# Patient Record
Sex: Female | Born: 1955
Health system: Southern US, Community
[De-identification: ages and names within clinical notes are randomized; demographics above are authoritative.]

## PROBLEM LIST (undated history)

## (undated) DIAGNOSIS — I10 Essential (primary) hypertension: Secondary | ICD-10-CM

## (undated) DIAGNOSIS — E7800 Pure hypercholesterolemia, unspecified: Secondary | ICD-10-CM

## (undated) DIAGNOSIS — R079 Chest pain, unspecified: Secondary | ICD-10-CM

## (undated) HISTORY — DX: Chest pain, unspecified: R07.9

## (undated) HISTORY — PX: TONSILLECTOMY: SHX5217

## (undated) HISTORY — DX: Pure hypercholesterolemia, unspecified: E78.00

## (undated) HISTORY — PX: ANKLE SURGERY: SHX546

## (undated) HISTORY — DX: Essential (primary) hypertension: I10

---

## 1998-02-01 ENCOUNTER — Ambulatory Visit (HOSPITAL_COMMUNITY): Admission: RE | Admit: 1998-02-01 | Discharge: 1998-02-01 | Payer: Self-pay | Admitting: *Deleted

## 1998-05-07 ENCOUNTER — Inpatient Hospital Stay (HOSPITAL_COMMUNITY): Admission: EM | Admit: 1998-05-07 | Discharge: 1998-05-08 | Payer: Self-pay | Admitting: Emergency Medicine

## 1999-02-19 ENCOUNTER — Ambulatory Visit (HOSPITAL_COMMUNITY): Admission: RE | Admit: 1999-02-19 | Discharge: 1999-02-19 | Payer: Self-pay | Admitting: *Deleted

## 1999-02-19 ENCOUNTER — Encounter: Payer: Self-pay | Admitting: *Deleted

## 2000-01-15 ENCOUNTER — Other Ambulatory Visit: Admission: RE | Admit: 2000-01-15 | Discharge: 2000-01-15 | Payer: Self-pay | Admitting: *Deleted

## 2000-04-26 ENCOUNTER — Ambulatory Visit (HOSPITAL_COMMUNITY): Admission: RE | Admit: 2000-04-26 | Discharge: 2000-04-26 | Payer: Self-pay | Admitting: *Deleted

## 2000-04-26 ENCOUNTER — Encounter: Payer: Self-pay | Admitting: *Deleted

## 2000-12-20 ENCOUNTER — Other Ambulatory Visit: Admission: RE | Admit: 2000-12-20 | Discharge: 2000-12-20 | Payer: Self-pay | Admitting: *Deleted

## 2001-04-28 ENCOUNTER — Encounter: Payer: Self-pay | Admitting: *Deleted

## 2001-04-28 ENCOUNTER — Ambulatory Visit (HOSPITAL_COMMUNITY): Admission: RE | Admit: 2001-04-28 | Discharge: 2001-04-28 | Payer: Self-pay | Admitting: *Deleted

## 2002-05-16 ENCOUNTER — Encounter: Payer: Self-pay | Admitting: *Deleted

## 2002-05-16 ENCOUNTER — Ambulatory Visit (HOSPITAL_COMMUNITY): Admission: RE | Admit: 2002-05-16 | Discharge: 2002-05-16 | Payer: Self-pay | Admitting: *Deleted

## 2003-06-05 ENCOUNTER — Encounter: Payer: Self-pay | Admitting: Obstetrics and Gynecology

## 2003-06-05 ENCOUNTER — Ambulatory Visit (HOSPITAL_COMMUNITY): Admission: RE | Admit: 2003-06-05 | Discharge: 2003-06-05 | Payer: Self-pay | Admitting: Obstetrics and Gynecology

## 2004-06-17 ENCOUNTER — Ambulatory Visit (HOSPITAL_COMMUNITY): Admission: RE | Admit: 2004-06-17 | Discharge: 2004-06-17 | Payer: Self-pay | Admitting: Obstetrics and Gynecology

## 2005-07-03 ENCOUNTER — Ambulatory Visit (HOSPITAL_COMMUNITY): Admission: RE | Admit: 2005-07-03 | Discharge: 2005-07-03 | Payer: Self-pay | Admitting: Obstetrics and Gynecology

## 2006-07-06 ENCOUNTER — Ambulatory Visit (HOSPITAL_COMMUNITY): Admission: RE | Admit: 2006-07-06 | Discharge: 2006-07-06 | Payer: Self-pay | Admitting: Obstetrics and Gynecology

## 2007-07-12 ENCOUNTER — Ambulatory Visit (HOSPITAL_COMMUNITY): Admission: RE | Admit: 2007-07-12 | Discharge: 2007-07-12 | Payer: Self-pay | Admitting: Internal Medicine

## 2007-09-28 ENCOUNTER — Ambulatory Visit (HOSPITAL_COMMUNITY): Admission: RE | Admit: 2007-09-28 | Discharge: 2007-09-28 | Payer: Self-pay | Admitting: Family Medicine

## 2008-07-17 ENCOUNTER — Ambulatory Visit (HOSPITAL_COMMUNITY): Admission: RE | Admit: 2008-07-17 | Discharge: 2008-07-17 | Payer: Self-pay | Admitting: Obstetrics and Gynecology

## 2009-06-03 ENCOUNTER — Encounter: Admission: RE | Admit: 2009-06-03 | Discharge: 2009-06-03 | Payer: Self-pay | Admitting: Specialist

## 2009-09-13 ENCOUNTER — Ambulatory Visit (HOSPITAL_COMMUNITY): Admission: RE | Admit: 2009-09-13 | Discharge: 2009-09-13 | Payer: Self-pay | Admitting: Obstetrics and Gynecology

## 2010-07-07 ENCOUNTER — Ambulatory Visit: Payer: Self-pay | Admitting: Cardiovascular Disease

## 2010-07-09 ENCOUNTER — Ambulatory Visit: Payer: Self-pay | Admitting: Cardiovascular Disease

## 2010-09-18 ENCOUNTER — Other Ambulatory Visit (HOSPITAL_COMMUNITY): Payer: Self-pay | Admitting: Obstetrics and Gynecology

## 2010-09-18 DIAGNOSIS — Z1239 Encounter for other screening for malignant neoplasm of breast: Secondary | ICD-10-CM

## 2010-09-18 DIAGNOSIS — Z1231 Encounter for screening mammogram for malignant neoplasm of breast: Secondary | ICD-10-CM

## 2010-10-08 ENCOUNTER — Other Ambulatory Visit: Payer: Self-pay

## 2010-10-21 ENCOUNTER — Inpatient Hospital Stay (HOSPITAL_COMMUNITY): Admission: RE | Admit: 2010-10-21 | Payer: Self-pay | Source: Ambulatory Visit

## 2010-11-04 ENCOUNTER — Other Ambulatory Visit: Payer: BC Managed Care – PPO

## 2010-11-19 ENCOUNTER — Other Ambulatory Visit: Payer: BC Managed Care – PPO | Admitting: *Deleted

## 2010-11-25 ENCOUNTER — Ambulatory Visit (HOSPITAL_COMMUNITY)
Admission: RE | Admit: 2010-11-25 | Discharge: 2010-11-25 | Disposition: A | Discharge status: Home / Self Care | Payer: BC Managed Care – PPO | Source: Ambulatory Visit | Attending: Obstetrics and Gynecology | Admitting: Obstetrics and Gynecology

## 2010-11-25 DIAGNOSIS — Z1231 Encounter for screening mammogram for malignant neoplasm of breast: Secondary | ICD-10-CM

## 2010-11-26 ENCOUNTER — Other Ambulatory Visit: Payer: BC Managed Care – PPO | Admitting: *Deleted

## 2010-11-27 ENCOUNTER — Ambulatory Visit (HOSPITAL_COMMUNITY): Payer: BC Managed Care – PPO

## 2010-12-12 ENCOUNTER — Other Ambulatory Visit: Payer: Self-pay | Admitting: *Deleted

## 2010-12-12 DIAGNOSIS — E7800 Pure hypercholesterolemia, unspecified: Secondary | ICD-10-CM

## 2010-12-15 ENCOUNTER — Other Ambulatory Visit: Payer: BC Managed Care – PPO | Admitting: *Deleted

## 2010-12-15 ENCOUNTER — Other Ambulatory Visit: Payer: Self-pay | Admitting: *Deleted

## 2010-12-15 DIAGNOSIS — E785 Hyperlipidemia, unspecified: Secondary | ICD-10-CM

## 2010-12-15 MED ORDER — NIACIN ER (ANTIHYPERLIPIDEMIC) 1000 MG PO TBCR: 1000.0000 mg | EXTENDED_RELEASE_TABLET | Freq: Every day | ORAL | Status: DC

## 2010-12-15 NOTE — Telephone Encounter (Signed)
Fax received from pharmacy. Refill completed. Jodette Kiaira Pointer RN  

## 2010-12-19 ENCOUNTER — Other Ambulatory Visit: Payer: Self-pay | Admitting: *Deleted

## 2010-12-19 DIAGNOSIS — E785 Hyperlipidemia, unspecified: Secondary | ICD-10-CM

## 2010-12-19 MED ORDER — NIACIN ER (ANTIHYPERLIPIDEMIC) 500 MG PO TBCR: 500.0000 mg | EXTENDED_RELEASE_TABLET | Freq: Two times a day (BID) | ORAL | Status: DC

## 2010-12-19 NOTE — Telephone Encounter (Signed)
Fax received from pharmacy. Refill completed. Jodette Adryel Wortmann RN  

## 2010-12-22 ENCOUNTER — Other Ambulatory Visit (INDEPENDENT_AMBULATORY_CARE_PROVIDER_SITE_OTHER): Payer: BC Managed Care – PPO | Admitting: Cardiovascular Disease

## 2010-12-22 ENCOUNTER — Other Ambulatory Visit (INDEPENDENT_AMBULATORY_CARE_PROVIDER_SITE_OTHER): Payer: BC Managed Care – PPO | Admitting: *Deleted

## 2010-12-22 DIAGNOSIS — E785 Hyperlipidemia, unspecified: Secondary | ICD-10-CM

## 2010-12-22 DIAGNOSIS — E7800 Pure hypercholesterolemia, unspecified: Secondary | ICD-10-CM

## 2010-12-22 LAB — LIPID PANEL
Cholesterol: 222 mg/dL — ABNORMAL HIGH (ref 0–200)
HDL: 73.2 mg/dL (ref >39.00)
Triglycerides: 103 mg/dL (ref 0.0–149.0)

## 2010-12-22 LAB — HEPATIC FUNCTION PANEL
ALT: 13 U/L (ref 0–35)
Bilirubin, Direct: 0.1 mg/dL (ref 0.0–0.3)
Total Protein: 6.6 g/dL (ref 6.0–8.3)

## 2010-12-22 LAB — BASIC METABOLIC PANEL
BUN: 16 mg/dL (ref 6–23)
CO2: 28 mEq/L (ref 19–32)
Calcium: 9.5 mg/dL (ref 8.4–10.5)
Chloride: 98 mEq/L (ref 96–112)
Creatinine, Ser: 0.6 mg/dL (ref 0.4–1.2)
GFR: 104.09 mL/min (ref >60.00)
Glucose, Bld: 94 mg/dL (ref 70–99)
Potassium: 4.4 mEq/L (ref 3.5–5.1)
Sodium: 134 mEq/L — ABNORMAL LOW (ref 135–145)

## 2010-12-22 LAB — LDL CHOLESTEROL, DIRECT: Direct LDL: 134.8 mg/dL

## 2010-12-23 ENCOUNTER — Telehealth: Payer: Self-pay | Admitting: *Deleted

## 2010-12-23 NOTE — Telephone Encounter (Signed)
Patient called with lab results/msg left. Jodette Giordano Getman RN  

## 2010-12-24 ENCOUNTER — Telehealth: Payer: Self-pay | Admitting: Cardiovascular Disease

## 2010-12-24 NOTE — Telephone Encounter (Signed)
Called wanting a hard copy of the results from the blood work that was done from Monday this week. I have pulled her chart.

## 2010-12-24 NOTE — Telephone Encounter (Signed)
Copy made and mailed.Alfonso Ramus RN

## 2011-10-21 ENCOUNTER — Other Ambulatory Visit (HOSPITAL_COMMUNITY): Payer: Self-pay | Admitting: Obstetrics and Gynecology

## 2011-10-21 DIAGNOSIS — Z1231 Encounter for screening mammogram for malignant neoplasm of breast: Secondary | ICD-10-CM

## 2011-12-11 ENCOUNTER — Ambulatory Visit (HOSPITAL_COMMUNITY)
Admission: RE | Admit: 2011-12-11 | Discharge: 2011-12-11 | Disposition: A | Discharge status: Home / Self Care | Payer: BC Managed Care – PPO | Source: Ambulatory Visit | Attending: Obstetrics and Gynecology | Admitting: Obstetrics and Gynecology

## 2011-12-11 DIAGNOSIS — Z1231 Encounter for screening mammogram for malignant neoplasm of breast: Secondary | ICD-10-CM | POA: Insufficient documentation

## 2012-01-05 ENCOUNTER — Other Ambulatory Visit: Payer: Self-pay | Admitting: *Deleted

## 2012-01-05 MED ORDER — NIACIN ER (ANTIHYPERLIPIDEMIC) 500 MG PO TBCR: 500.0000 mg | EXTENDED_RELEASE_TABLET | Freq: Every day | ORAL | Status: DC

## 2012-01-05 NOTE — Telephone Encounter (Signed)
Fax Received. Refill Completed. Seanpaul Preece Chowoe (R.M.A)   

## 2012-01-05 NOTE — Telephone Encounter (Signed)
Fax Received. Refill Completed. Calysta Craigo Chowoe (R.M.A)   

## 2012-02-24 ENCOUNTER — Ambulatory Visit: Payer: BC Managed Care – PPO | Admitting: Cardiovascular Disease

## 2012-03-28 ENCOUNTER — Ambulatory Visit (INDEPENDENT_AMBULATORY_CARE_PROVIDER_SITE_OTHER): Payer: BC Managed Care – PPO | Admitting: Cardiovascular Disease

## 2012-03-28 ENCOUNTER — Encounter: Payer: Self-pay | Admitting: Cardiovascular Disease

## 2012-03-28 VITALS — BP 119/80 | HR 95 | Ht 66.0 in | Wt 151.8 lb

## 2012-03-28 DIAGNOSIS — I10 Essential (primary) hypertension: Secondary | ICD-10-CM

## 2012-03-28 DIAGNOSIS — R000 Tachycardia, unspecified: Secondary | ICD-10-CM | POA: Insufficient documentation

## 2012-03-28 DIAGNOSIS — E785 Hyperlipidemia, unspecified: Secondary | ICD-10-CM | POA: Insufficient documentation

## 2012-03-28 MED ORDER — NIACIN ER (ANTIHYPERLIPIDEMIC) 500 MG PO TBCR: 500.0000 mg | EXTENDED_RELEASE_TABLET | Freq: Every day | ORAL | Status: DC

## 2012-03-28 NOTE — Assessment & Plan Note (Signed)
She has a history of hypertension. Blood pressure is well-controlled today. We'll continue to monitor this.  She has been tachycardic in the past. Her heart rate is a little and facet. We'll check a TSH we'll recheck her other labs later this week.

## 2012-03-28 NOTE — Progress Notes (Signed)
    Edgardo Roys Date of Birth  24-Jun-1956       [facility] Office [address]  [address] [place], [place]  [address]   [place], [place]  [address] [phone]     [phone]   Fax  [phone]    Fax [phone]  Problem List: 1. Hypertension 2. Chest pain 3. Hyperlipidemia  History of Present Illness:  Sheri Montgomery is doing well.  She has been busy taking care of her elderly father.  She has not had any cardiac problems.  She is walking several days a week.   Current Outpatient Prescriptions on File Prior to Visit  Medication Sig Dispense Refill  . aspirin 81 MG tablet Take 81 mg by mouth daily.      . Cholecalciferol (VITAMIN D PO) Take by mouth daily.      . Naproxen Sodium (ALEVE PO) Take by mouth as needed.      . niacin (NIASPAN) 500 MG CR tablet Take 1 tablet (500 mg total) by mouth at bedtime.  30 tablet  1  . NON FORMULARY Viactive (Calcium)        Allergies  Allergen Reactions  . Crestor (Rosuvastatin)     Causes muscle aches     Past Medical History  Diagnosis Date  . Hypertension   . Chest pain   . Hypercholesterolemia     Past Surgical History  Procedure Date  . Ankle surgery   . Tonsillectomy     History  Smoking status  . Unknown If Ever Smoked  Smokeless tobacco  . Not on file    History  Alcohol Use  . Yes    Family History  Problem Relation Age of Onset  . Heart attack Father   . Heart attack Brother     Reviw of Systems:  Reviewed in the HPI.  All other systems are negative.  Physical Exam: Blood pressure 119/80, pulse 95, height 5\' 6"  (1.676 m), weight 151 lb 12.8 oz (68.856 kg). General: Well developed, well nourished, in no acute distress.  Head: Normocephalic, atraumatic, sclera non-icteric, mucus membranes are moist,   Neck: Supple. Carotids are 2 + without bruits. No JVD  Lungs: Clear bilaterally to auscultation.  Heart: regular rate.  normal  S1 S2. No  murmurs, gallops or rubs.  Abdomen: Soft, non-tender, non-distended with normal bowel sounds. No hepatomegaly. No rebound/guarding. No masses.  Msk:  Strength and tone are normal  Extremities: No clubbing or cyanosis. No edema.  Distal pedal pulses are 2+ and equal bilaterally.  Neuro: Alert and oriented X 3. Moves all extremities spontaneously.  Psych:  Responds to questions appropriately with a normal affect.  ECG: 03/28/12 - NSR, no ST or T wave changes.  Assessment / Plan:

## 2012-03-28 NOTE — Patient Instructions (Addendum)
Your physician wants you to follow-up in: 1 year You will receive a reminder letter in the mail two months in advance. If you don't receive a letter, please call our office to schedule the follow-up appointment.  Your physician recommends that you return for a FASTING lipid profile: tomorrow and 6 months

## 2012-03-28 NOTE — Assessment & Plan Note (Signed)
She's been maintained on Niaspan. We will check a fasting lipids later this week.

## 2012-03-29 ENCOUNTER — Ambulatory Visit (INDEPENDENT_AMBULATORY_CARE_PROVIDER_SITE_OTHER): Payer: BC Managed Care – PPO | Admitting: *Deleted

## 2012-03-29 DIAGNOSIS — R000 Tachycardia, unspecified: Secondary | ICD-10-CM

## 2012-03-29 DIAGNOSIS — I10 Essential (primary) hypertension: Secondary | ICD-10-CM

## 2012-03-29 DIAGNOSIS — E785 Hyperlipidemia, unspecified: Secondary | ICD-10-CM

## 2012-03-29 LAB — BASIC METABOLIC PANEL
BUN: 16 mg/dL (ref 6–23)
CO2: 30 mEq/L (ref 19–32)
Chloride: 102 mEq/L (ref 96–112)
GFR: 80.98 mL/min (ref >60.00)
Glucose, Bld: 94 mg/dL (ref 70–99)
Potassium: 4.2 mEq/L (ref 3.5–5.1)
Sodium: 140 mEq/L (ref 135–145)

## 2012-03-29 LAB — HEPATIC FUNCTION PANEL
ALT: 22 U/L (ref 0–35)
AST: 32 U/L (ref 0–37)
Albumin: 4.3 g/dL (ref 3.5–5.2)
Alkaline Phosphatase: 54 U/L (ref 39–117)
Bilirubin, Direct: 0.1 mg/dL (ref 0.0–0.3)
Total Protein: 6.9 g/dL (ref 6.0–8.3)

## 2012-03-29 LAB — LIPID PANEL: HDL: 83.4 mg/dL (ref >39.00)

## 2012-04-01 ENCOUNTER — Telehealth: Payer: Self-pay | Admitting: *Deleted

## 2012-04-01 DIAGNOSIS — E785 Hyperlipidemia, unspecified: Secondary | ICD-10-CM

## 2012-04-01 MED ORDER — ATORVASTATIN CALCIUM 10 MG PO TABS: 10.0000 mg | ORAL_TABLET | Freq: Every day | ORAL | Status: DC

## 2012-04-01 NOTE — Telephone Encounter (Signed)
Message copied by Antony Odea on Fri Apr 01, 2012  7:04 PM ------      Message from: Vesta Mixer      Created: Thu Mar 31, 2012  3:59 PM       Thyroid level is okay to.  LDL cholesterol is mildly to moderately elevated. Start atorvastatin 10 mg at night. Recheck labs in 3 months.            Marland Kitchen

## 2012-04-01 NOTE — Telephone Encounter (Signed)
Results discussed, 3 month lab date set, med ordered, pt to call with any questions or concerns

## 2012-07-06 ENCOUNTER — Other Ambulatory Visit (INDEPENDENT_AMBULATORY_CARE_PROVIDER_SITE_OTHER): Payer: BC Managed Care – PPO

## 2012-07-06 DIAGNOSIS — E785 Hyperlipidemia, unspecified: Secondary | ICD-10-CM

## 2012-07-06 LAB — HEPATIC FUNCTION PANEL
ALT: 21 U/L (ref 0–35)
AST: 30 U/L (ref 0–37)
Albumin: 4.4 g/dL (ref 3.5–5.2)
Alkaline Phosphatase: 57 U/L (ref 39–117)
Bilirubin, Direct: 0.1 mg/dL (ref 0.0–0.3)
Total Protein: 7.3 g/dL (ref 6.0–8.3)

## 2012-07-06 LAB — LIPID PANEL
HDL: 78.4 mg/dL (ref >39.00)
Total CHOL/HDL Ratio: 3

## 2012-07-06 LAB — BASIC METABOLIC PANEL
CO2: 28 mEq/L (ref 19–32)
Glucose, Bld: 88 mg/dL (ref 70–99)
Potassium: 4.3 mEq/L (ref 3.5–5.1)
Sodium: 139 mEq/L (ref 135–145)

## 2012-07-08 ENCOUNTER — Other Ambulatory Visit: Payer: Self-pay | Admitting: *Deleted

## 2012-07-08 DIAGNOSIS — E785 Hyperlipidemia, unspecified: Secondary | ICD-10-CM

## 2012-07-08 MED ORDER — ATORVASTATIN CALCIUM 10 MG PO TABS: 10.0000 mg | ORAL_TABLET | Freq: Every day | ORAL | Status: DC

## 2012-07-08 NOTE — Telephone Encounter (Signed)
Pt needed med refill, done.

## 2012-07-19 ENCOUNTER — Encounter: Payer: Self-pay | Admitting: Cardiovascular Disease

## 2012-10-24 ENCOUNTER — Encounter: Payer: Self-pay | Admitting: Cardiovascular Disease

## 2012-10-25 ENCOUNTER — Encounter: Payer: Self-pay | Admitting: Cardiovascular Disease

## 2012-11-29 ENCOUNTER — Other Ambulatory Visit (HOSPITAL_COMMUNITY): Payer: Self-pay | Admitting: Obstetrics and Gynecology

## 2012-11-29 DIAGNOSIS — Z1231 Encounter for screening mammogram for malignant neoplasm of breast: Secondary | ICD-10-CM

## 2012-12-13 ENCOUNTER — Ambulatory Visit (HOSPITAL_COMMUNITY)
Admission: RE | Admit: 2012-12-13 | Discharge: 2012-12-13 | Disposition: A | Discharge status: Home / Self Care | Payer: BC Managed Care – PPO | Source: Ambulatory Visit | Attending: Obstetrics and Gynecology | Admitting: Obstetrics and Gynecology

## 2012-12-13 DIAGNOSIS — Z1231 Encounter for screening mammogram for malignant neoplasm of breast: Secondary | ICD-10-CM

## 2013-03-20 ENCOUNTER — Encounter: Payer: Self-pay | Admitting: Cardiovascular Disease

## 2013-03-22 ENCOUNTER — Other Ambulatory Visit (INDEPENDENT_AMBULATORY_CARE_PROVIDER_SITE_OTHER): Payer: BC Managed Care – PPO

## 2013-03-22 ENCOUNTER — Other Ambulatory Visit: Payer: Self-pay | Admitting: *Deleted

## 2013-03-22 DIAGNOSIS — E785 Hyperlipidemia, unspecified: Secondary | ICD-10-CM

## 2013-03-22 LAB — BASIC METABOLIC PANEL
BUN: 16 mg/dL (ref 6–23)
CO2: 28 mEq/L (ref 19–32)
Chloride: 103 mEq/L (ref 96–112)
Creatinine, Ser: 0.7 mg/dL (ref 0.4–1.2)
Glucose, Bld: 89 mg/dL (ref 70–99)

## 2013-03-22 LAB — HEPATIC FUNCTION PANEL
ALT: 18 U/L (ref 0–35)
AST: 21 U/L (ref 0–37)
Bilirubin, Direct: 0.1 mg/dL (ref 0.0–0.3)
Total Bilirubin: 0.7 mg/dL (ref 0.3–1.2)

## 2013-03-22 LAB — LIPID PANEL: Total CHOL/HDL Ratio: 3

## 2013-03-22 NOTE — Progress Notes (Signed)
Orders for labs done.

## 2013-03-29 ENCOUNTER — Ambulatory Visit (INDEPENDENT_AMBULATORY_CARE_PROVIDER_SITE_OTHER): Payer: BC Managed Care – PPO | Admitting: Cardiovascular Disease

## 2013-03-29 ENCOUNTER — Encounter: Payer: Self-pay | Admitting: Cardiovascular Disease

## 2013-03-29 VITALS — BP 102/68 | HR 83 | Ht 66.0 in | Wt 149.0 lb

## 2013-03-29 DIAGNOSIS — I10 Essential (primary) hypertension: Secondary | ICD-10-CM

## 2013-03-29 DIAGNOSIS — R000 Tachycardia, unspecified: Secondary | ICD-10-CM

## 2013-03-29 DIAGNOSIS — E785 Hyperlipidemia, unspecified: Secondary | ICD-10-CM

## 2013-03-29 MED ORDER — ATORVASTATIN CALCIUM 10 MG PO TABS: 10.0000 mg | ORAL_TABLET | Freq: Every day | ORAL | Status: DC

## 2013-03-29 NOTE — Progress Notes (Signed)
    Sheri Montgomery Date of Birth  1956-08-02       [facility] Office [address]  [address] [place], [place]  [address]   [place], [place]  [address] [phone]     [phone]   Fax  [phone]    Fax [phone]  Problem List: 1. Hypertension 2. Chest pain 3. Hyperlipidemia  History of Present Illness:  Sheri Montgomery is doing well.  She has been busy taking care of her elderly father.  She has not had any cardiac problems.  She is walking several days a week.   March 29, 2013:  Sheri Montgomery is doing well.  Exercising on occasion.  She is not tolerating the niacin.    Trying to stick to a good diet.   Current Outpatient Prescriptions on File Prior to Visit  Medication Sig Dispense Refill  . aspirin 81 MG tablet Take 81 mg by mouth daily.      Marland Kitchen atorvastatin (LIPITOR) 10 MG tablet Take 1 tablet (10 mg total) by mouth daily.  90 tablet  3  . Cholecalciferol (VITAMIN D PO) Take by mouth daily.      . Naproxen Sodium (ALEVE PO) Take by mouth as needed.      . NON FORMULARY Viactive (Calcium)       No current facility-administered medications on file prior to visit.    Allergies  Allergen Reactions  . Crestor (Rosuvastatin)     Causes muscle aches     Past Medical History  Diagnosis Date  . Hypertension   . Chest pain   . Hypercholesterolemia     Past Surgical History  Procedure Laterality Date  . Ankle surgery    . Tonsillectomy      History  Smoking status  . Never Smoker   Smokeless tobacco  . Not on file    History  Alcohol Use  . Yes    Family History  Problem Relation Age of Onset  . Heart attack Father   . Heart attack Brother     Reviw of Systems:  Reviewed in the HPI.  All other systems are negative.  Physical Exam: Blood pressure 102/68, pulse 83, height 5\' 6"  (1.676 m), weight 149 lb (67.586 kg). General: Well developed, well nourished, in no acute distress.  Head: Normocephalic,  atraumatic, sclera non-icteric, mucus membranes are moist,   Neck: Supple. Carotids are 2 + without bruits. No JVD  Lungs: Clear bilaterally to auscultation.  Heart: regular rate.  normal  S1 S2. No murmurs, gallops or rubs.  Abdomen: Soft, non-tender, non-distended with normal bowel sounds. No hepatomegaly. No rebound/guarding. No masses.  Msk:  Strength and tone are normal  Extremities: No clubbing or cyanosis. No edema.  Distal pedal pulses are 2+ and equal bilaterally.  Neuro: Alert and oriented X 3. Moves all extremities spontaneously.  Psych:  Responds to questions appropriately with a normal affect.  ECG: 03/28/12 - NSR, no ST or T wave changes.  Assessment / Plan:

## 2013-03-29 NOTE — Assessment & Plan Note (Signed)
Her lipids are well controlled.   She is not tolerating the niacin.  She may DC it.    I will see her again in 1 year for OV and fasting labs and ECG.

## 2013-03-29 NOTE — Patient Instructions (Addendum)
Your physician wants you to follow-up in: 1 year  You will receive a reminder letter in the mail two months in advance. If you don't receive a letter, please call our office to schedule the follow-up appointment.   Your physician has recommended you make the following change in your medication:  Stop niacin   Your physician recommends that you return for a FASTING lipid profile: 1 year with tsh

## 2013-04-03 ENCOUNTER — Encounter: Payer: Self-pay | Admitting: Cardiovascular Disease

## 2013-07-06 ENCOUNTER — Other Ambulatory Visit: Payer: Self-pay

## 2013-11-15 ENCOUNTER — Other Ambulatory Visit (HOSPITAL_COMMUNITY): Payer: Self-pay | Admitting: Obstetrics and Gynecology

## 2013-11-15 DIAGNOSIS — Z1231 Encounter for screening mammogram for malignant neoplasm of breast: Secondary | ICD-10-CM

## 2013-12-15 ENCOUNTER — Ambulatory Visit (HOSPITAL_COMMUNITY)
Admission: RE | Admit: 2013-12-15 | Discharge: 2013-12-15 | Disposition: A | Discharge status: Home / Self Care | Payer: BC Managed Care – PPO | Source: Ambulatory Visit | Attending: Obstetrics and Gynecology | Admitting: Obstetrics and Gynecology

## 2013-12-15 DIAGNOSIS — Z1231 Encounter for screening mammogram for malignant neoplasm of breast: Secondary | ICD-10-CM

## 2014-04-09 ENCOUNTER — Other Ambulatory Visit: Payer: Self-pay | Admitting: Cardiovascular Disease

## 2014-04-18 ENCOUNTER — Ambulatory Visit: Payer: BC Managed Care – PPO | Admitting: Cardiovascular Disease

## 2014-05-08 ENCOUNTER — Ambulatory Visit (INDEPENDENT_AMBULATORY_CARE_PROVIDER_SITE_OTHER): Payer: BC Managed Care – PPO | Admitting: *Deleted

## 2014-05-08 DIAGNOSIS — R000 Tachycardia, unspecified: Secondary | ICD-10-CM

## 2014-05-08 DIAGNOSIS — E785 Hyperlipidemia, unspecified: Secondary | ICD-10-CM

## 2014-05-08 DIAGNOSIS — I10 Essential (primary) hypertension: Secondary | ICD-10-CM

## 2014-05-08 LAB — LIPID PANEL
CHOL/HDL RATIO: 3
Cholesterol: 217 mg/dL — ABNORMAL HIGH (ref 0–200)
HDL: 77.4 mg/dL (ref >39.00)
LDL Cholesterol: 110 mg/dL — ABNORMAL HIGH (ref 0–99)
NONHDL: 139.6
Triglycerides: 149 mg/dL (ref 0.0–149.0)
VLDL: 29.8 mg/dL (ref 0.0–40.0)

## 2014-05-08 LAB — BASIC METABOLIC PANEL
BUN: 18 mg/dL (ref 6–23)
CALCIUM: 9.4 mg/dL (ref 8.4–10.5)
CHLORIDE: 103 meq/L (ref 96–112)
CO2: 26 mEq/L (ref 19–32)
Creatinine, Ser: 0.8 mg/dL (ref 0.4–1.2)
GFR: 81.59 mL/min (ref >60.00)
Glucose, Bld: 85 mg/dL (ref 70–99)
Potassium: 3.9 mEq/L (ref 3.5–5.1)
Sodium: 139 mEq/L (ref 135–145)

## 2014-05-08 LAB — HEPATIC FUNCTION PANEL
ALT: 18 U/L (ref 0–35)
AST: 29 U/L (ref 0–37)
Albumin: 4.2 g/dL (ref 3.5–5.2)
Alkaline Phosphatase: 55 U/L (ref 39–117)
BILIRUBIN TOTAL: 1 mg/dL (ref 0.2–1.2)
Bilirubin, Direct: 0.1 mg/dL (ref 0.0–0.3)
Total Protein: 7 g/dL (ref 6.0–8.3)

## 2014-05-08 LAB — TSH: TSH: 3.36 u[IU]/mL (ref 0.35–4.50)

## 2014-05-10 ENCOUNTER — Ambulatory Visit (INDEPENDENT_AMBULATORY_CARE_PROVIDER_SITE_OTHER): Payer: BC Managed Care – PPO | Admitting: Cardiovascular Disease

## 2014-05-10 ENCOUNTER — Encounter: Payer: Self-pay | Admitting: Cardiovascular Disease

## 2014-05-10 VITALS — BP 118/80 | HR 86 | Ht 66.0 in | Wt 148.6 lb

## 2014-05-10 DIAGNOSIS — I10 Essential (primary) hypertension: Secondary | ICD-10-CM

## 2014-05-10 DIAGNOSIS — E785 Hyperlipidemia, unspecified: Secondary | ICD-10-CM

## 2014-05-10 MED ORDER — ATORVASTATIN CALCIUM 10 MG PO TABS: ORAL_TABLET | ORAL | Status: DC

## 2014-05-10 NOTE — Progress Notes (Signed)
    Sheri Montgomery Date of Birth  [birth year]       [facility] Office [address]  [address] [place], [place]  [address]   [place], [place]  [address] [phone]     [phone]   Fax  [phone]    Fax [phone]  Problem List: 1. hyperlipidemia 2. Chest pain 3. Hypertension - now controlled with diet.   History of Present Illness:  Sheri Montgomery is doing well.  She has been busy taking care of her elderly father.  She has not had any cardiac problems.  She is walking several days a week.   March 29, 2013:  Sheri Montgomery is doing well.  Exercising on occasion.  She is not tolerating the niacin.    Trying to stick to a good diet.   05/10/2014:    Sheri Montgomery is doing ok.  Still busy taking care of her father (lives in [place])  She was walking regularly until 2 months ago when she injurred her foot.     Current Outpatient Prescriptions on File Prior to Visit  Medication Sig Dispense Refill  . aspirin 81 MG tablet Take 81 mg by mouth daily.      Marland Kitchen atorvastatin (LIPITOR) 10 MG tablet TAKE 1 BY MOUTH DAILY  90 tablet  0  . Cholecalciferol (VITAMIN D PO) Take by mouth daily.      . Naproxen Sodium (ALEVE PO) Take by mouth as needed.      . NON FORMULARY Viactive (Calcium)      . ranitidine (ZANTAC) 75 MG tablet Take 75 mg by mouth 2 (two) times daily.       No current facility-administered medications on file prior to visit.    Allergies  Allergen Reactions  . Crestor [Rosuvastatin]     Causes muscle aches     Past Medical History  Diagnosis Date  . Hypertension   . Chest pain   . Hypercholesterolemia     Past Surgical History  Procedure Laterality Date  . Ankle surgery    . Tonsillectomy      History  Smoking status  . Never Smoker   Smokeless tobacco  . Not on file    History  Alcohol Use  . Yes    Family History  Problem Relation Age of Onset  . Heart attack Father   . Heart attack Brother     Reviw  of Systems:  Reviewed in the HPI.  All other systems are negative.  Physical Exam: Blood pressure 118/80, pulse 86, height  (1.676 m), weight 148 lb 9.6 oz (67.405 kg). General: Well developed, well nourished, in no acute distress.  Head: Normocephalic, atraumatic, sclera non-icteric, mucus membranes are moist,   Neck: Supple. Carotids are 2 + without bruits. No JVD  Lungs: Clear bilaterally to auscultation.  Heart: regular rate.  normal  S1 S2. No murmurs, gallops or rubs.  Abdomen: Soft, non-tender, non-distended with normal bowel sounds. No hepatomegaly. No rebound/guarding. No masses.  Msk:  Strength and tone are normal  Extremities: No clubbing or cyanosis. No edema.  Distal pedal pulses are 2+ and equal bilaterally.  Neuro: Alert and oriented X 3. Moves all extremities spontaneously.  Psych:  Responds to questions appropriately with a normal affect.  ECG: 05/10/2014: Normal sinus rhythm.  She  has no ST or T wave changes to  Assessment / Plan:

## 2014-05-10 NOTE — Patient Instructions (Signed)
Your physician recommends that you continue on your current medications as directed. Please refer to the Current Medication list given to you today.  Your physician recommends that you return for FASTING lab work in 1 year at next OV.   Your physician wants you to follow-up in: 1 year with Dr. Elease Hashimoto. You will receive a reminder letter in the mail two months in advance. If you don't receive a letter, please call our office to schedule the follow-up appointment.

## 2014-05-10 NOTE — Assessment & Plan Note (Signed)
Her lipids are a little bit elevated. I suspect this is because she's not walking as much as she should be. She's injured her foot and has not a lot of exercise for the past several months. She is reporting getting back into her walking program.  Continue with her same medications. We have refilled her on atorvastatin. We'll see her in one year for office visit and fasting lipids, liver enzymes, and basic metabolic profile.

## 2014-11-15 ENCOUNTER — Other Ambulatory Visit (HOSPITAL_COMMUNITY): Payer: Self-pay | Admitting: Obstetrics and Gynecology

## 2014-11-15 DIAGNOSIS — Z1231 Encounter for screening mammogram for malignant neoplasm of breast: Secondary | ICD-10-CM

## 2014-12-19 ENCOUNTER — Ambulatory Visit (HOSPITAL_COMMUNITY)
Admission: RE | Admit: 2014-12-19 | Discharge: 2014-12-19 | Disposition: A | Discharge status: Home / Self Care | Payer: BLUE CROSS/BLUE SHIELD | Source: Ambulatory Visit | Attending: Obstetrics and Gynecology | Admitting: Obstetrics and Gynecology

## 2014-12-19 DIAGNOSIS — Z1231 Encounter for screening mammogram for malignant neoplasm of breast: Secondary | ICD-10-CM | POA: Insufficient documentation

## 2015-05-10 ENCOUNTER — Other Ambulatory Visit (INDEPENDENT_AMBULATORY_CARE_PROVIDER_SITE_OTHER): Payer: BLUE CROSS/BLUE SHIELD | Admitting: *Deleted

## 2015-05-10 DIAGNOSIS — I10 Essential (primary) hypertension: Secondary | ICD-10-CM | POA: Diagnosis not present

## 2015-05-10 DIAGNOSIS — E785 Hyperlipidemia, unspecified: Secondary | ICD-10-CM | POA: Diagnosis not present

## 2015-05-10 LAB — BASIC METABOLIC PANEL
BUN: 14 mg/dL (ref 6–23)
CO2: 25 mEq/L (ref 19–32)
Calcium: 9.7 mg/dL (ref 8.4–10.5)
Chloride: 104 mEq/L (ref 96–112)
Creatinine, Ser: 0.78 mg/dL (ref 0.40–1.20)
GFR: 80.11 mL/min (ref >60.00)
GLUCOSE: 81 mg/dL (ref 70–99)
POTASSIUM: 4.2 meq/L (ref 3.5–5.1)
Sodium: 142 mEq/L (ref 135–145)

## 2015-05-10 LAB — LIPID PANEL
Cholesterol: 196 mg/dL (ref 0–200)
HDL: 74.9 mg/dL (ref >39.00)
LDL Cholesterol: 99 mg/dL (ref 0–99)
NONHDL: 120.83
Total CHOL/HDL Ratio: 3
Triglycerides: 108 mg/dL (ref 0.0–149.0)
VLDL: 21.6 mg/dL (ref 0.0–40.0)

## 2015-05-10 LAB — HEPATIC FUNCTION PANEL
ALT: 25 U/L (ref 0–35)
AST: 31 U/L (ref 0–37)
Albumin: 4.4 g/dL (ref 3.5–5.2)
Alkaline Phosphatase: 68 U/L (ref 39–117)
BILIRUBIN TOTAL: 0.6 mg/dL (ref 0.2–1.2)
Bilirubin, Direct: 0.1 mg/dL (ref 0.0–0.3)
Total Protein: 6.9 g/dL (ref 6.0–8.3)

## 2015-05-10 NOTE — Addendum Note (Signed)
Addended by: Tonita Phoenix on: 05/10/2015 07:51 AM   Modules accepted: Orders

## 2015-05-14 ENCOUNTER — Ambulatory Visit (INDEPENDENT_AMBULATORY_CARE_PROVIDER_SITE_OTHER): Payer: BLUE CROSS/BLUE SHIELD | Admitting: Cardiovascular Disease

## 2015-05-14 ENCOUNTER — Encounter: Payer: Self-pay | Admitting: Cardiovascular Disease

## 2015-05-14 VITALS — BP 130/80 | HR 106 | Ht 66.0 in | Wt 146.0 lb

## 2015-05-14 DIAGNOSIS — E785 Hyperlipidemia, unspecified: Secondary | ICD-10-CM | POA: Diagnosis not present

## 2015-05-14 DIAGNOSIS — R000 Tachycardia, unspecified: Secondary | ICD-10-CM | POA: Diagnosis not present

## 2015-05-14 DIAGNOSIS — I10 Essential (primary) hypertension: Secondary | ICD-10-CM | POA: Diagnosis not present

## 2015-05-14 NOTE — Progress Notes (Signed)
Sheri Montgomery Date of Birth  [birth year]       [facility] Office [address]  [address] [place], [place]  [address]   [place], [place]  [address] [phone]     [phone]   Fax  [phone]    Fax [phone]  Problem List: 1. hyperlipidemia 2. Chest pain 3. Hypertension - now controlled with diet.   History of Present Illness:  Sheri Montgomery is doing well.  She has been busy taking care of her elderly father.  She has not had any cardiac problems.  She is walking several days a week.   March 29, 2013:  Sheri Montgomery is doing well.  Exercising on occasion.  She is not tolerating the niacin.    Trying to stick to a good diet.   05/10/2014:    Sheri Montgomery is doing ok.  Still busy taking care of her father (lives in [place])  She was walking regularly until 2 months ago when she injurred her foot.    Sept. 13, 2016:  Sheri Montgomery is doing well.  Is now on Phentermine - HR is faster .   She feels like her system is "Revved up "   No CP , doing well.   Current Outpatient Prescriptions on File Prior to Visit  Medication Sig Dispense Refill  . aspirin 81 MG tablet Take 81 mg by mouth daily.    Marland Kitchen atorvastatin (LIPITOR) 10 MG tablet TAKE 1 BY MOUTH DAILY 90 tablet 3  . Cholecalciferol (VITAMIN D PO) Take by mouth daily.    . Naproxen Sodium (ALEVE PO) Take 1 tablet by mouth as needed.     . NON FORMULARY Take 1,000 mg by mouth 2 (two) times daily. Viactive (Calcium)     No current facility-administered medications on file prior to visit.    Allergies  Allergen Reactions  . Crestor [Rosuvastatin]     Causes muscle aches     Past Medical History  Diagnosis Date  . Hypertension   . Chest pain   . Hypercholesterolemia     Past Surgical History  Procedure Laterality Date  . Ankle surgery    . Tonsillectomy      History  Smoking status  . Never Smoker   Smokeless tobacco  . Not on file    History  Alcohol Use  . Yes     Family History  Problem Relation Age of Onset  . Heart attack Father   . Heart attack Brother   . Stroke Neg Hx   . Hypertension Father     Reviw of Systems:  Reviewed in the HPI.  All other systems are negative.  Physical Exam: Blood pressure 130/80, pulse 106, height  (1.676 m), weight 66.225 kg (146 lb). General: Well developed, well nourished, in no acute distress.  Head: Normocephalic, atraumatic, sclera non-icteric, mucus membranes are moist,   Neck: Supple. Carotids are 2 + without bruits. No JVD  Lungs: Clear bilaterally to auscultation.  Heart: regular rate.  normal  S1 S2. No murmurs, gallops or rubs.  Abdomen: Soft, non-tender, non-distended with normal bowel sounds. No hepatomegaly. No rebound/guarding. No masses.  Msk:  Strength and tone are normal  Extremities: No clubbing or cyanosis. No edema.  Distal pedal pulses are 2+ and equal bilaterally.  Neuro: Alert and oriented X 3. Moves all extremities spontaneously.  Psych:  Responds to questions appropriately with a normal affect.  ECG: Sept . 13,  2016.  Sinus tach at 106.    Assessment / Plan:   1. Hyperlipidemia -  Recent lipids looks great .  Continue current dose of Atorvastatin   Will check again in 1 year.  2. Chest pain - no recent CP  3. Hypertension - now controlled with diet.  4. Sinus tachycardia - likely due to the Phentermine.   Will have her cut the dose in 1/2 and she will call us with HR and BP in several weeks.    Nahser, Deloris Ping, MD  05/14/2015 11:17 AM    [facility] [address] [place],  [address] [place], [place]  [address] Pager [phone] Phone: [phone]; Fax: [phone]   [facility] [place]  [address] [place], [place]  [address] [phone]   Fax [phone]

## 2015-05-14 NOTE — Patient Instructions (Addendum)
Medication Instructions:  DECREASE Phentermine to 1/2 tab daily  Labwork: Your physician recommends that you return for lab work (cholesterol, liver, bmet) in: 1 year on the day of or a few days before your office visit with Dr. Elease Hashimoto.  You will need to FAST for this appointment - nothing to eat or drink after midnight the night before except water.   Testing/Procedures: None Ordered   Follow-Up: Call Eligha Bridegroom, RN in 1-2 weeks to report your heart rate [phone])  Your physician wants you to follow-up in: 1 year with Dr. Elease Hashimoto.  You will receive a reminder letter in the mail two months in advance. If you don't receive a letter, please call our office to schedule the follow-up appointment.

## 2015-06-11 ENCOUNTER — Other Ambulatory Visit: Payer: Self-pay | Admitting: Cardiovascular Disease

## 2015-08-12 ENCOUNTER — Telehealth: Payer: Self-pay | Admitting: Cardiovascular Disease

## 2015-08-12 DIAGNOSIS — R55 Syncope and collapse: Secondary | ICD-10-CM

## 2015-08-12 NOTE — Telephone Encounter (Signed)
New Message  Pt has list of HR for 2 weeks. Pt requested to discuss these w/ RN. Please call back and discuss.

## 2015-08-12 NOTE — Telephone Encounter (Signed)
Spoke with patient who states she was advised at last office visit with Dr. Elease HashimotoNahser on 9/13 to monitor pulse rate because it was elevated at that visit.  Dr. Elease HashimotoNahser thought it was related to patient taking Phentermine.  He advised her to reduce to 1/2 pill and monitor BP and pulse rate and pulse remained elevated to reduce to 1/4 pill and not to stop medication suddenly.  She states she reduced to 1/4 pill and pulse remained 90 bpm or greater.  She states on November 16, while still taking 1/4 pill, she passed out suddenly while standing in the kitchen.  She states she thought it was related to Phentermine so she stopped it.  States pulse has remained elevated (90 bpm or greater) and then on Saturday 12/10 she felt a funny feelilng come over her and felt her heart racing.  She was able to go and lay down.  States she checked her BP and HR which were:  203/183 mmHg, HR 178 bpm - taken when she first laid down; a little while later she got 86/63 mmHg, HR 184 bpm.  She states she felt a similar feeling yesterday and pulse was 108 at its highest. States pulse remains elevated; today 100's to 116 bpm; BP normal.  I advised her that Dr. Elease HashimotoNahser is working in the hospital this week and that I will discuss s/s with Norma FredricksonLori Gerhardt, NP and call her back.  She verbalized understanding and agreement.

## 2015-08-12 NOTE — Telephone Encounter (Signed)
Spoke with Sheri FredricksonLori Gerhardt, NP who advised we order echocardiogram, event monitor, and TSH and have her see [name] next week.  [name] is in agreement with plan by telephone.  I spoke with patient who verbalized understanding and agreement.  She is scheduled for event monitor and lab appointment tomorrow, 12/13 but will not be able to get an echo until 12/28.  She is scheduled to see [name] on Tuesday 12/20.  I advised her that we will talk to her as results become available and that I will look for a cancellation to get the echo this week.  She thanked me for the call.

## 2015-08-13 ENCOUNTER — Encounter: Payer: Self-pay | Admitting: *Deleted

## 2015-08-13 ENCOUNTER — Ambulatory Visit (INDEPENDENT_AMBULATORY_CARE_PROVIDER_SITE_OTHER): Payer: BLUE CROSS/BLUE SHIELD

## 2015-08-13 ENCOUNTER — Other Ambulatory Visit (INDEPENDENT_AMBULATORY_CARE_PROVIDER_SITE_OTHER): Payer: BLUE CROSS/BLUE SHIELD | Admitting: *Deleted

## 2015-08-13 DIAGNOSIS — I10 Essential (primary) hypertension: Secondary | ICD-10-CM

## 2015-08-13 DIAGNOSIS — R55 Syncope and collapse: Secondary | ICD-10-CM

## 2015-08-13 LAB — TSH: TSH: 2.437 u[IU]/mL (ref 0.350–4.500)

## 2015-08-13 NOTE — Progress Notes (Deleted)
Patient ID: Edgardo RoysCynthia L Punt, female   DOB: [birth year], 59 y.o.   MRN: [id number] Patient arrived for his appointment to have a 30 day cardiac event monitor applied.  After reviewing the function of the monitor and its instructions, the patient declined to have the monitor applied.  He is not thoroughly convinced a monitor is necessary and would like to speak with Dr. Nita Sickleonika Patel and Dr. Ronna PolioJennifer Walker prior to proceeding.

## 2015-08-20 ENCOUNTER — Ambulatory Visit (INDEPENDENT_AMBULATORY_CARE_PROVIDER_SITE_OTHER): Payer: BLUE CROSS/BLUE SHIELD | Admitting: Cardiovascular Disease

## 2015-08-20 ENCOUNTER — Encounter: Payer: Self-pay | Admitting: Cardiovascular Disease

## 2015-08-20 VITALS — BP 122/70 | HR 91 | Ht 66.0 in | Wt 143.8 lb

## 2015-08-20 DIAGNOSIS — I10 Essential (primary) hypertension: Secondary | ICD-10-CM

## 2015-08-20 DIAGNOSIS — E785 Hyperlipidemia, unspecified: Secondary | ICD-10-CM | POA: Diagnosis not present

## 2015-08-20 DIAGNOSIS — R55 Syncope and collapse: Secondary | ICD-10-CM | POA: Diagnosis not present

## 2015-08-20 MED ORDER — METOPROLOL SUCCINATE ER 25 MG PO TB24: 25.0000 mg | ORAL_TABLET | Freq: Every day | ORAL | Status: DC

## 2015-08-20 NOTE — Progress Notes (Signed)
Sheri Montgomery Date of Birth  [birth year]       [facility]    [place] [facility] [address], [address]  [address], [address] [place], [place]  [address]   [place]  [id number] [phone]     [phone]   Fax  [phone]    Fax [phone]  Problem List: 1. hyperlipidemia 2. Chest pain 3. Hypertension - now controlled with diet.   History of Present Illness:  Sheri Montgomery is doing well.  She has been busy taking care of her elderly father.  She has not had any cardiac problems.  She is walking several days a week.   March 29, 2013:  Sheri Montgomery is doing well.  Exercising on occasion.  She is not tolerating the niacin.    Trying to stick to a good diet.   05/10/2014:    Sheri Montgomery is doing ok.  Still busy taking care of her father (lives in [place])  She was walking regularly until 2 months ago when she injurred her foot.    Sept. 13, 2016:  Sheri Montgomery is doing well.  Is now on Phentermine - HR is faster .   She feels like her system is "Revved up "   No CP , doing well.   Dec. 20, 2016 Sheri Montgomery has passed out twice  Has been on Phentermine.   Cut the dose in 1/2 and then to 1/4 tablet.  On Nov. 16, was standing in the kitchen,  Had a split second warning and then passed out. Has been dieting ,  Had not been eating much ( but no less that usual )  Quit Phenermine on that day  Several weeks later,  ( Dec. 10)  She was again standin in the kitchen, felt her hear racing. Could not tell if her HR was regular or irregular . Took her HR and BP  Multiple times for the next hour   Dec. 10, 2016  9:29 203/183 -   HR = 178 9:40           HR  Of 184 9:52 120 /78 HR 123 10:16 120/83  HR 129 11:46 147/94  HR119 1:51 134/88  HR 112 3:48 140/92  HR 115 9:31 112/72  HR 101   Rested for about 30 minutes. Felt normal about 1 hr later.    Has noticed more DOE for the past several weeks.   Current Outpatient Prescriptions on File Prior to Visit  Medication Sig  Dispense Refill  . aspirin 81 MG tablet Take 81 mg by mouth daily.    Marland Kitchen. atorvastatin (LIPITOR) 10 MG tablet TAKE 1 BY MOUTH DAILY 90 tablet 3  . Cholecalciferol (VITAMIN D PO) Take by mouth daily.    Marland Kitchen. docusate sodium (COLACE) 50 MG capsule Take 50 mg by mouth 2 (two) times daily as needed for mild constipation.    . Naproxen Sodium (ALEVE PO) Take 1 tablet by mouth as needed.     . NON FORMULARY Take 1,000 mg by mouth 2 (two) times daily. Viactive (Calcium)    . phentermine (ADIPEX-P) 37.5 MG tablet Take 37.5 mg by mouth daily before breakfast. Reported on 08/20/2015     No current facility-administered medications on file prior to visit.    Allergies  Allergen Reactions  . Crestor [Rosuvastatin]     Causes muscle aches     Past Medical History  Diagnosis Date  . Hypertension   . Chest pain   . Hypercholesterolemia  Past Surgical History  Procedure Laterality Date  . Ankle surgery    . Tonsillectomy      History  Smoking status  . Never Smoker   Smokeless tobacco  . Not on file    History  Alcohol Use  . Yes    Family History  Problem Relation Age of Onset  . Heart attack Father   . Heart attack Brother   . Stroke Neg Hx   . Hypertension Father     Reviw of Systems:  Reviewed in the HPI.  All other systems are negative.  Physical Exam: Blood pressure 122/70, pulse 91, height  (1.676 m), weight 143 lb 12.8 oz (65.227 kg). General: Well developed, well nourished, in no acute distress.  Head: Normocephalic, atraumatic, sclera non-icteric, mucus membranes are moist,   Neck: Supple. Carotids are 2 + without bruits. No JVD  Lungs: Clear bilaterally to auscultation.  Heart: regular rate.  normal  S1 S2. No murmurs, gallops or rubs.  Abdomen: Soft, non-tender, non-distended with normal bowel sounds. No hepatomegaly. No rebound/guarding. No masses.  Msk:  Strength and tone are normal  Extremities: No clubbing or cyanosis. No edema.  Distal pedal  pulses are 2+ and equal bilaterally.  Neuro: Alert and oriented X 3. Moves all extremities spontaneously.  Psych:  Responds to questions appropriately with a normal affect.  ECG: Dec. 20, 2016:  NSR at 91with occasional PVCs  Assessment / Plan:   1. Syncope:  Sheri Asp presents with an episode of syncope.   Several weeks later, she had an unusual episode where her HR was very high and quite variable.   Clinically it sounds like she has paroxysmal atrial fib.  She is wearing a 30 day  Monitor Echo is scheduled.   Will get a stress myoview. TSH is normal  Will start her on toprol XL 25 mg a day  Will see her in 6 weeks.    1. Hyperlipidemia -  Recent lipids looks great .  Continue current dose of Atorvastatin   Will check again in 1 year.  2. Chest pain - no recent CP  3. Hypertension - now controlled with diet.  4. Sinus tachycardia - l This should improve with the low dose toprol    Nahser, Deloris Ping, MD  08/20/2015 9:50 AM    [facility] [address] [place],  [address] [place], [place]  [address] Pager [phone] Phone: [phone]; Fax: [phone]   [facility]  [address] [place], [place]  [address] [phone]   Fax [phone]

## 2015-08-20 NOTE — Patient Instructions (Addendum)
Medication Instructions:  START Toprol XL 25 mg once daily   Labwork: None Ordered   Testing/Procedures: Your physician has requested that you have an exercise stress myoview. For further information please visit [email or web address] Please follow instruction sheet, as given.   Follow-Up: Your physician recommends that you schedule a follow-up appointment in: 6 weeks with Dr. Elease HashimotoNahser   If you need a refill on your cardiac medications before your next appointment, please call your pharmacy.   Thank you for choosing [facility]! Eligha BridegroomMichelle Swinyer, RN [phone]

## 2015-08-27 ENCOUNTER — Telehealth (HOSPITAL_COMMUNITY): Payer: Self-pay | Admitting: *Deleted

## 2015-08-27 NOTE — Telephone Encounter (Signed)
Patient given detailed instructions per Myocardial Perfusion Study Information Sheet for the test on 08/29/16 at 0715. Patient notified to arrive 15 minutes early and that it is imperative to arrive on time for appointment to keep from having the test rescheduled.  If you need to cancel or reschedule your appointment, please call the office within 24 hours of your appointment. Failure to do so may result in a cancellation of your appointment, and a $50 no show fee. Patient verbalized understanding.Clarine Elrod, Adelene IdlerCynthia W

## 2015-08-28 ENCOUNTER — Ambulatory Visit (HOSPITAL_COMMUNITY): Payer: BLUE CROSS/BLUE SHIELD | Attending: Cardiovascular Disease

## 2015-08-28 ENCOUNTER — Other Ambulatory Visit: Payer: Self-pay

## 2015-08-28 DIAGNOSIS — R55 Syncope and collapse: Secondary | ICD-10-CM | POA: Diagnosis not present

## 2015-08-28 DIAGNOSIS — I351 Nonrheumatic aortic (valve) insufficiency: Secondary | ICD-10-CM | POA: Diagnosis not present

## 2015-08-28 DIAGNOSIS — I10 Essential (primary) hypertension: Secondary | ICD-10-CM | POA: Diagnosis not present

## 2015-08-28 DIAGNOSIS — E785 Hyperlipidemia, unspecified: Secondary | ICD-10-CM | POA: Diagnosis not present

## 2015-08-28 DIAGNOSIS — I071 Rheumatic tricuspid insufficiency: Secondary | ICD-10-CM | POA: Diagnosis not present

## 2015-08-28 DIAGNOSIS — I517 Cardiomegaly: Secondary | ICD-10-CM | POA: Insufficient documentation

## 2015-08-28 DIAGNOSIS — I77810 Thoracic aortic ectasia: Secondary | ICD-10-CM | POA: Diagnosis not present

## 2015-08-30 ENCOUNTER — Ambulatory Visit (HOSPITAL_COMMUNITY): Payer: BLUE CROSS/BLUE SHIELD | Attending: Cardiology

## 2015-08-30 ENCOUNTER — Encounter (HOSPITAL_COMMUNITY): Payer: Self-pay

## 2015-08-30 DIAGNOSIS — Z8249 Family history of ischemic heart disease and other diseases of the circulatory system: Secondary | ICD-10-CM | POA: Insufficient documentation

## 2015-08-30 DIAGNOSIS — R55 Syncope and collapse: Secondary | ICD-10-CM

## 2015-08-30 DIAGNOSIS — R0609 Other forms of dyspnea: Secondary | ICD-10-CM | POA: Diagnosis not present

## 2015-08-30 DIAGNOSIS — I10 Essential (primary) hypertension: Secondary | ICD-10-CM | POA: Insufficient documentation

## 2015-08-30 LAB — MYOCARDIAL PERFUSION IMAGING
CHL CUP MPHR: 160 {beats}/min
CHL CUP NUCLEAR SDS: 5
CHL CUP NUCLEAR SRS: 3
CHL CUP NUCLEAR SSS: 8
CHL CUP RESTING HR STRESS: 75 {beats}/min
CSEPEDS: 0 s
Estimated workload: 8.5 METS
Exercise duration (min): 7 min
LV sys vol: 13 mL
LVDIAVOL: 59 mL
NUC STRESS TID: 0.72
Peak HR: 148 {beats}/min
Percent HR: 93 %
RATE: 0.26

## 2015-08-30 MED ORDER — TECHNETIUM TC 99M SESTAMIBI GENERIC - CARDIOLITE
10.2000 | Freq: Once | INTRAVENOUS | Status: AC | PRN
  Administered 2015-08-30: 10 via INTRAVENOUS

## 2015-08-30 MED ORDER — TECHNETIUM TC 99M SESTAMIBI GENERIC - CARDIOLITE
32.9000 | Freq: Once | INTRAVENOUS | Status: AC | PRN
  Administered 2015-08-30: 32.9 via INTRAVENOUS

## 2015-09-23 ENCOUNTER — Encounter: Payer: Self-pay | Admitting: Cardiovascular Disease

## 2015-09-23 ENCOUNTER — Ambulatory Visit (INDEPENDENT_AMBULATORY_CARE_PROVIDER_SITE_OTHER): Payer: BLUE CROSS/BLUE SHIELD | Admitting: Cardiovascular Disease

## 2015-09-23 VITALS — BP 114/74 | HR 84 | Ht 66.0 in | Wt 144.1 lb

## 2015-09-23 DIAGNOSIS — I351 Nonrheumatic aortic (valve) insufficiency: Secondary | ICD-10-CM | POA: Diagnosis not present

## 2015-09-23 DIAGNOSIS — I471 Supraventricular tachycardia, unspecified: Secondary | ICD-10-CM | POA: Insufficient documentation

## 2015-09-23 MED ORDER — METOPROLOL SUCCINATE ER 25 MG PO TB24: 25.0000 mg | ORAL_TABLET | Freq: Every day | ORAL | Status: DC

## 2015-09-23 NOTE — Patient Instructions (Signed)
Medication Instructions:  The current medical regimen is effective;  continue present plan and medications.  Follow-Up: Follow up in 6 months with Dr. Elease Hashimoto.  You will receive a letter in the mail 2 months before you are due.  Please call us when you receive this letter to schedule your follow up appointment.  If you need a refill on your cardiac medications before your next appointment, please call your pharmacy.  Thank you for choosing [facility]!!

## 2015-09-23 NOTE — Progress Notes (Signed)
Sheri Montgomery Date of Birth  1956-01-05       [facility] Office [address]  [address] [place], [place]  [address]   [place], [place]  [address] [phone]     [phone]   Fax  [phone]    Fax [phone]  Problem List: 1. hyperlipidemia 2. Chest pain 3. Hypertension - now controlled with diet.   History of Present Illness:  Sheri Montgomery is doing well.  She has been busy taking care of her elderly father.  She has not had any cardiac problems.  She is walking several days a week.   March 29, 2013:  Sheri Montgomery is doing well.  Exercising on occasion.  She is not tolerating the niacin.    Trying to stick to a good diet.   05/10/2014:    Sheri Montgomery is doing ok.  Still busy taking care of her father (lives in [place])  She was walking regularly until 2 months ago when she injurred her foot.    Sept. 13, 2016:  Sheri Montgomery is doing well.  Is now on Phentermine - HR is faster .   She feels like her system is "Revved up "   No CP , doing well.   09/15/2015 Sheri Montgomery has passed out twice  Has been on Phentermine.   Cut the dose in 1/2 and then to 1/4 tablet.  On Nov. 16, was standing in the kitchen,  Had a split second warning and then passed out. Has been dieting ,  Had not been eating much ( but no less that usual )  Quit Phenermine on that day  Several weeks later,  ( Dec. 10)  She was again standin in the kitchen, felt her hear racing. Could not tell if her HR was regular or irregular . Took her HR and BP  Multiple times for the next hour   Dec. 10, 2016  9:29 203/183 -   HR = 178 9:40           HR  Of 184 9:52 120 /78 HR 123 10:16 120/83  HR 129 11:46 147/94  HR119 1:51 134/88  HR 112 3:48 140/92  HR 115 9:31 112/72  HR 101   Rested for about 30 minutes. Felt normal about 1 hr later.    Has noticed more DOE for the past several weeks.   Jan. 23, 2017: Sheri Montgomery is doing ok.   Had at least 2 episodes of SVT on the  monitor . Was started on metoprolol XL 25 mg a day.   Did not want to increase metoprolol but added Propranolol to her low dose metoprolol .  Stress myoview was normal - no ischemia, normal LV function .     Current Outpatient Prescriptions on File Prior to Visit  Medication Sig Dispense Refill  . aspirin 81 MG tablet Take 81 mg by mouth daily.    Marland Kitchen atorvastatin (LIPITOR) 10 MG tablet TAKE 1 BY MOUTH DAILY 90 tablet 3  . Cholecalciferol (VITAMIN D PO) Take 1 tablet by mouth daily.     Marland Kitchen docusate sodium (COLACE) 50 MG capsule Take 50 mg by mouth 2 (two) times daily as needed for mild constipation.    . metoprolol succinate (TOPROL-XL) 25 MG 24 hr tablet Take 1 tablet (25 mg total) by mouth daily. 31 tablet 11  . Naproxen Sodium (ALEVE PO) Take 1 tablet by mouth as needed (AS NEEDED FOR MILD  PAIN).     . NON FORMULARY Take 1,000 mg by mouth 2 (two) times daily. Viactive (Calcium)     No current facility-administered medications on file prior to visit.    Allergies  Allergen Reactions  . Crestor [Rosuvastatin]     Causes muscle aches     Past Medical History  Diagnosis Date  . Hypertension   . Chest pain   . Hypercholesterolemia     Past Surgical History  Procedure Laterality Date  . Ankle surgery    . Tonsillectomy      History  Smoking status  . Never Smoker   Smokeless tobacco  . Not on file    History  Alcohol Use  . Yes    Family History  Problem Relation Age of Onset  . Heart attack Father   . Heart attack Brother   . Stroke Neg Hx   . Hypertension Father     Reviw of Systems:  Reviewed in the HPI.  All other systems are negative.  Physical Exam: Blood pressure 114/74, pulse 84, height  (1.676 m), weight 144 lb 1.9 oz (65.372 kg). General: Well developed, well nourished, in no acute distress.  Head: Normocephalic, atraumatic, sclera non-icteric, mucus membranes are moist,   Neck: Supple. Carotids are 2 + without bruits. No JVD  Lungs:  Clear bilaterally to auscultation.  Heart: regular rate.  normal  S1 S2. No murmurs, gallops or rubs.  Abdomen: Soft, non-tender, non-distended with normal bowel sounds. No hepatomegaly. No rebound/guarding. No masses.  Msk:  Strength and tone are normal  Extremities: No clubbing or cyanosis. No edema.  Distal pedal pulses are 2+ and equal bilaterally.  Neuro: Alert and oriented X 3. Moves all extremities spontaneously.  Psych:  Responds to questions appropriately with a normal affect.  ECG: Dec. 20, 2016:  NSR at 91with occasional PVCs  Assessment / Plan:   1. Syncope:  No further episodes of syncope.    2. Aortic insufficiency - she has mild AI with mild aortic root dilation.    Will get an echo in December, 2017   3. Hypertension - now controlled with diet.   4. SVT -  Continue metoprolol at current dose.   we discussed the valsalva and the diving reflex. She has not had any significant episodes since starting the metoprolol.  We discussed adding Propranolol but at this point, I would hold off and would start only  if she has recurrent episodes of SVT  5. Hyperlipidemia -  Recent lipids looks great .  Continue current dose of Atorvastatin   Will check again in 1 year.   [name], Deloris Ping, MD  09/23/2015 9:43 AM    [facility] [address] [place] [address] [place],  [address] [place], [place]  [address] Pager [phone] Phone: [phone]; Fax: [phone]   [facility]  [address] [place], [place]  [address] [phone]   Fax [phone]

## 2015-11-11 ENCOUNTER — Other Ambulatory Visit: Payer: Self-pay

## 2015-11-11 DIAGNOSIS — Z1231 Encounter for screening mammogram for malignant neoplasm of breast: Secondary | ICD-10-CM

## 2016-01-07 ENCOUNTER — Ambulatory Visit
Admission: RE | Admit: 2016-01-07 | Discharge: 2016-01-07 | Disposition: A | Discharge status: Home / Self Care | Payer: BLUE CROSS/BLUE SHIELD | Source: Ambulatory Visit

## 2016-01-07 DIAGNOSIS — Z1231 Encounter for screening mammogram for malignant neoplasm of breast: Secondary | ICD-10-CM | POA: Diagnosis not present

## 2016-03-20 ENCOUNTER — Ambulatory Visit (INDEPENDENT_AMBULATORY_CARE_PROVIDER_SITE_OTHER): Payer: BLUE CROSS/BLUE SHIELD | Admitting: Cardiovascular Disease

## 2016-03-20 ENCOUNTER — Encounter: Payer: Self-pay | Admitting: Cardiovascular Disease

## 2016-03-20 VITALS — BP 108/66 | HR 88 | Ht 66.0 in | Wt 144.1 lb

## 2016-03-20 DIAGNOSIS — E785 Hyperlipidemia, unspecified: Secondary | ICD-10-CM | POA: Diagnosis not present

## 2016-03-20 DIAGNOSIS — I471 Supraventricular tachycardia: Secondary | ICD-10-CM | POA: Diagnosis not present

## 2016-03-20 LAB — LIPID PANEL
CHOL/HDL RATIO: 2.1 ratio (ref <=5.0)
Cholesterol: 202 mg/dL — ABNORMAL HIGH (ref 125–200)
HDL: 96 mg/dL (ref >=46)
LDL CALC: 91 mg/dL (ref <130)
TRIGLYCERIDES: 75 mg/dL (ref <150)
VLDL: 15 mg/dL (ref <30)

## 2016-03-20 LAB — COMPREHENSIVE METABOLIC PANEL
ALBUMIN: 4.2 g/dL (ref 3.6–5.1)
ALT: 18 U/L (ref 6–29)
AST: 27 U/L (ref 10–35)
Alkaline Phosphatase: 61 U/L (ref 33–130)
BUN: 17 mg/dL (ref 7–25)
CHLORIDE: 103 mmol/L (ref 98–110)
CO2: 26 mmol/L (ref 20–31)
CREATININE: 0.77 mg/dL (ref 0.50–0.99)
Calcium: 9.5 mg/dL (ref 8.6–10.4)
Glucose, Bld: 98 mg/dL (ref 65–99)
Potassium: 4.3 mmol/L (ref 3.5–5.3)
SODIUM: 139 mmol/L (ref 135–146)
Total Bilirubin: 0.8 mg/dL (ref 0.2–1.2)
Total Protein: 6.7 g/dL (ref 6.1–8.1)

## 2016-03-20 MED ORDER — PROPRANOLOL HCL 10 MG PO TABS: 10.0000 mg | ORAL_TABLET | Freq: Four times a day (QID) | ORAL | Status: DC | PRN

## 2016-03-20 NOTE — Progress Notes (Signed)
Sheri Montgomery Sheri Montgomery Date of Birth  Dec 20, 1955       [facility]    [place] [facility] [address]  [address] [place], [place]  [address]   [place]  [id number] [phone]     [phone]   Fax  [phone]    Fax [phone]  Problem List: 1. hyperlipidemia 2. Chest pain 3. Hypertension - now controlled with diet.   History of Present Illness:  Sheri AspCindy is doing well.  She has been busy taking care of her elderly father.  She has not had any cardiac problems.  She is walking several days a week.   March 29, 2013:  Sheri AspCindy is doing well.  Exercising on occasion.  She is not tolerating the niacin.    Trying to stick to a good diet.   05/10/2014:    Sheri AspCindy is doing ok.  Still busy taking care of her father (lives in [place])  She was walking regularly until 2 months ago when she injurred her foot.    Sept. 13, 2016:  Sheri AspCindy is doing well.  Is now on Phentermine - HR is faster .   She feels like her system is "Revved up "   No CP , doing well.   Dec. 20, 2016 Sheri AspCindy has passed out twice  Has been on Phentermine.   Cut the dose in 1/2 and then to 1/4 tablet.  On Nov. 16, was standing in the kitchen,  Had a split second warning and then passed out. Has been dieting ,  Had not been eating much ( but no less that usual )  Quit Phenermine on that day  Several weeks later,  ( Dec. 10)  She was again standin in the kitchen, felt her hear racing. Could not tell if her HR was regular or irregular . Took her HR and BP  Multiple times for the next hour   Dec. 10, 2016  9:29 203/183 -   HR = 178 9:40           HR  Of 184 9:52 120 /78 HR 123 10:16 120/83  HR 129 11:46 147/94  HR119 1:51 134/88  HR 112 3:48 140/92  HR 115 9:31 112/72  HR 101   Rested for about 30 minutes. Felt normal about 1 hr later.    Has noticed more DOE for the past several weeks.   Jan. 23, 2017: Sheri AspCindy is doing ok.   Had at least 2 episodes of SVT on the  monitor . Was started on metoprolol XL 25 mg a day.   Did not want to increase metoprolol but added Propranolol to her low dose metoprolol .  Stress myoview was normal - no ischemia, normal LV function .   March 20, 2016  Doing ok.   Takes Toprol XL 25 mg after lunch Notices that her HR is faster in the afternoon.  Needs to walk more    Current Outpatient Prescriptions on File Prior to Visit  Medication Sig Dispense Refill  . aspirin 81 MG tablet Take 81 mg by mouth daily.    . Cholecalciferol (VITAMIN D PO) Take 1 tablet by mouth daily.     Marland Kitchen. docusate sodium (COLACE) 50 MG capsule Take 50 mg by mouth 2 (two) times daily as needed for mild constipation.    . metoprolol succinate (TOPROL-XL) 25 MG 24 hr tablet Take 1 tablet (25 mg total) by mouth daily. 90 tablet 3  .  Naproxen Sodium (ALEVE PO) Take 1 tablet by mouth as needed (AS NEEDED FOR MILD PAIN).     . NON FORMULARY Take 1,000 mg by mouth 2 (two) times daily. Viactive (Calcium)     No current facility-administered medications on file prior to visit.    Allergies  Allergen Reactions  . Crestor [Rosuvastatin]     Causes muscle aches     Past Medical History  Diagnosis Date  . Hypertension   . Chest pain   . Hypercholesterolemia     Past Surgical History  Procedure Laterality Date  . Ankle surgery    . Tonsillectomy      History  Smoking status  . Never Smoker   Smokeless tobacco  . Not on file    History  Alcohol Use  . Yes    Family History  Problem Relation Age of Onset  . Heart attack Father   . Hypertension Father   . Heart attack Brother   . Stroke Neg Hx     Reviw of Systems:  Reviewed in the HPI.  All other systems are negative.  Physical Exam: Blood pressure 108/66, pulse 88, height  (1.676 m), weight 144 lb 1.9 oz (65.372 kg), SpO2 98 %. General: Well developed, well nourished, in no acute distress.  Head: Normocephalic, atraumatic, sclera non-icteric, mucus membranes are moist,     Neck: Supple. Carotids are 2 + without bruits. No JVD  Lungs: Clear bilaterally to auscultation.  Heart: regular rate.  normal  S1 S2. No murmurs, gallops or rubs.  Abdomen: Soft, non-tender, non-distended with normal bowel sounds. No hepatomegaly. No rebound/guarding. No masses.  Msk:  Strength and tone are normal  Extremities: No clubbing or cyanosis. No edema.  Distal pedal pulses are 2+ and equal bilaterally.  Neuro: Alert and oriented X 3. Moves all extremities spontaneously.  Psych:  Responds to questions appropriately with a normal affect.  ECG:    Assessment / Plan:   1. Syncope:  No further episodes of syncope.    2. Aortic insufficiency - she has mild AI with mild aortic root dilation.      3. Hypertension - now controlled with diet.   4. SVT -  Continue metoprolol XL  at current dose.   will add PRN propranolol to help with her mild tachycardia that she has in the afternoon   5. Hyperlipidemia -  Recent lipids looks great .  Continue current dose of Atorvastatin   Will check labs  today   Kristeen Miss, MD  03/20/2016 10:44 AM    [facility] [address] [place],  [address] [place], [place]  [address] Pager [phone] Phone: [phone]; Fax: [phone]

## 2016-03-20 NOTE — Patient Instructions (Signed)
Medication Instructions:  TAKE Propranolol 10 mg up to 4 times per day as needed for fast heart rate and/or palpitations   Labwork: TODAY - cholesterol, complete metabolic panel   Testing/Procedures: None Ordered   Follow-Up: Your physician wants you to follow-up in: 6 months with Dr. Elease HashimotoNahser.  You will receive a reminder letter in the mail two months in advance. If you don't receive a letter, please call our office to schedule the follow-up appointment.   If you need a refill on your cardiac medications before your next appointment, please call your pharmacy.   Thank you for choosing [facility]! Eligha BridegroomMichelle Stepfon Rawles, RN [phone]

## 2016-03-25 DIAGNOSIS — Z01419 Encounter for gynecological examination (general) (routine) without abnormal findings: Secondary | ICD-10-CM | POA: Diagnosis not present

## 2016-03-25 DIAGNOSIS — Z6824 Body mass index (BMI) 24.0-24.9, adult: Secondary | ICD-10-CM | POA: Diagnosis not present

## 2016-03-26 ENCOUNTER — Telehealth: Payer: Self-pay | Admitting: Cardiovascular Disease

## 2016-03-26 NOTE — Telephone Encounter (Signed)
New Message ° ° °Pt returning call about lab results. °

## 2016-03-26 NOTE — Telephone Encounter (Signed)
New Message:    Please call,concerning her lab results in my-chart. Also never received her prescription for Propranolol.

## 2016-03-26 NOTE — Telephone Encounter (Signed)
Left detailed message on patient's voice mail that I sent propranolol Rx to [facility] mail order on 7/21. I advised her to call back if she would like me to send it to a local pharmacy.

## 2016-03-26 NOTE — Telephone Encounter (Signed)
Follow Up:     Pt says she have not received her Propranolol prescription,

## 2016-03-26 NOTE — Telephone Encounter (Signed)
Reviewed lab results with patient who verbalized understanding and gratitude.  She states she does not need a new Rx for propranolol sent to local pharmacy.

## 2016-03-26 NOTE — Telephone Encounter (Signed)
Follow Up:      Please disregard last note,that should have gone to [facility].

## 2016-03-26 NOTE — Telephone Encounter (Signed)
Follow Up.      I sent a note to Sheri Montgomery,saying she had not received her medicine.

## 2016-03-26 NOTE — Telephone Encounter (Signed)
Follow Up:    I sent a note to [facility] about her not receiving her medicine.

## 2016-05-07 DIAGNOSIS — M8589 Other specified disorders of bone density and structure, multiple sites: Secondary | ICD-10-CM | POA: Diagnosis not present

## 2016-05-12 DIAGNOSIS — Z0000 Encounter for general adult medical examination without abnormal findings: Secondary | ICD-10-CM | POA: Diagnosis not present

## 2016-05-12 DIAGNOSIS — Z23 Encounter for immunization: Secondary | ICD-10-CM | POA: Diagnosis not present

## 2016-07-06 ENCOUNTER — Other Ambulatory Visit: Payer: Self-pay | Admitting: *Deleted

## 2016-07-06 MED ORDER — ATORVASTATIN CALCIUM 10 MG PO TABS: 10.0000 mg | ORAL_TABLET | Freq: Every day | ORAL | 2 refills | Status: DC

## 2016-07-15 DIAGNOSIS — L814 Other melanin hyperpigmentation: Secondary | ICD-10-CM | POA: Diagnosis not present

## 2016-07-15 DIAGNOSIS — L821 Other seborrheic keratosis: Secondary | ICD-10-CM | POA: Diagnosis not present

## 2016-07-15 DIAGNOSIS — D1801 Hemangioma of skin and subcutaneous tissue: Secondary | ICD-10-CM | POA: Diagnosis not present

## 2016-07-15 DIAGNOSIS — D2271 Melanocytic nevi of right lower limb, including hip: Secondary | ICD-10-CM | POA: Diagnosis not present

## 2016-09-15 ENCOUNTER — Other Ambulatory Visit: Payer: Self-pay

## 2016-09-15 MED ORDER — METOPROLOL SUCCINATE ER 25 MG PO TB24: 25.0000 mg | ORAL_TABLET | Freq: Every day | ORAL | 3 refills | Status: DC

## 2016-09-16 ENCOUNTER — Ambulatory Visit: Payer: BLUE CROSS/BLUE SHIELD | Admitting: Cardiovascular Disease

## 2016-11-18 ENCOUNTER — Ambulatory Visit: Payer: BLUE CROSS/BLUE SHIELD | Admitting: Cardiovascular Disease

## 2016-11-24 ENCOUNTER — Other Ambulatory Visit: Payer: Self-pay | Admitting: Obstetrics and Gynecology

## 2016-11-24 DIAGNOSIS — Z1231 Encounter for screening mammogram for malignant neoplasm of breast: Secondary | ICD-10-CM

## 2016-12-14 ENCOUNTER — Encounter: Payer: Self-pay | Admitting: Physician Assistant

## 2016-12-14 ENCOUNTER — Ambulatory Visit (INDEPENDENT_AMBULATORY_CARE_PROVIDER_SITE_OTHER): Payer: BLUE CROSS/BLUE SHIELD | Admitting: Physician Assistant

## 2016-12-14 VITALS — BP 122/62 | HR 76 | Ht 66.0 in | Wt 153.1 lb

## 2016-12-14 DIAGNOSIS — E7849 Other hyperlipidemia: Secondary | ICD-10-CM

## 2016-12-14 DIAGNOSIS — I10 Essential (primary) hypertension: Secondary | ICD-10-CM

## 2016-12-14 DIAGNOSIS — I351 Nonrheumatic aortic (valve) insufficiency: Secondary | ICD-10-CM

## 2016-12-14 DIAGNOSIS — E784 Other hyperlipidemia: Secondary | ICD-10-CM

## 2016-12-14 DIAGNOSIS — I471 Supraventricular tachycardia, unspecified: Secondary | ICD-10-CM

## 2016-12-14 NOTE — Patient Instructions (Signed)
Medication Instructions:  No changes.   Labwork: In 6 mos (within 1 week of follow up with Dr. Delane Ginger) - BMET, Lipids, LFTs  Testing/Procedures: None   Follow-Up: Dr. Delane Ginger in 6 months.   Any Other Special Instructions Will Be Listed Below (If Applicable).  If you need a refill on your cardiac medications before your next appointment, please call your pharmacy.

## 2016-12-14 NOTE — Progress Notes (Signed)
Cardiology Office Note:    Date:  12/14/2016   ID:  KODA DEFRANK, DOB 08-14-1956, MRN [id number]  PCP:  Gweneth Dimitri, MD  Cardiologist:  Dr. Delane Ginger   Electrophysiologist:  n/a  Referring MD: Gweneth Dimitri, MD   Chief Complaint  Patient presents with  . Follow-up    SVT, HTN, Aortic Insufficiency    History of Present Illness:    ROSSETTA Montgomery is a 61 y.o. female with a hx of HTN, AI, SVT, HL.  Last seen by Dr. Delane Ginger in 7/17.    She returns for Cardiology follow up.  She is here alone.  Since last seen, she denies chest pain, shortness of breath, syncope, orthopnea, PND or significant pedal edema. She has not had palpitations and has not had to use prn Propranolol.    Prior CV studies:   The following studies were reviewed today:  Event monitor 12/16 NSR with wpisodes of SVT The episodes of SVT are brief and nonsustaned  Echo 12/16 Mild focal basal septal hypertrophy, EF 60-65, normal wall motion, grade 2 diastolic dysfunction, moderate AI, ascending aorta diameter 43 mm (moderately dilated), trivial TR  Myoview 12/16 Normal exercise stress myovue with no ischemia or infarction EF 78%   Past Medical History:  Diagnosis Date  . Chest pain   . Hypercholesterolemia   . Hypertension     Past Surgical History:  Procedure Laterality Date  . ANKLE SURGERY    . TONSILLECTOMY      Current Medications: Current Meds  Medication Sig  . aspirin 81 MG tablet Take 81 mg by mouth daily.  Marland Kitchen atorvastatin (LIPITOR) 10 MG tablet Take 1 tablet (10 mg total) by mouth daily.  . Cholecalciferol (VITAMIN D PO) Take 1 tablet by mouth daily.   Marland Kitchen docusate sodium (COLACE) 50 MG capsule Take 50 mg by mouth 2 (two) times daily as needed for mild constipation.  . metoprolol succinate (TOPROL-XL) 25 MG 24 hr tablet Take 1 tablet (25 mg total) by mouth daily.  . Naproxen Sodium (ALEVE PO) Take 1 tablet by mouth as needed (AS NEEDED FOR MILD PAIN).   . NON FORMULARY Take 1,000  mg by mouth 2 (two) times daily. Viactive (Calcium)  . propranolol (INDERAL) 10 MG tablet Take 1 tablet (10 mg total) by mouth 4 (four) times daily as needed (fast heart rate and/or palpitations).     Allergies:   Crestor [rosuvastatin]   Social History   Social History  . Marital status: Married    Spouse name: N/A  . Number of children: N/A  . Years of education: N/A   Social History Main Topics  . Smoking status: Never Smoker  . Smokeless tobacco: Never Used  . Alcohol use Yes  . Drug use: Unknown  . Sexual activity: Not Asked   Other Topics Concern  . None   Social History Narrative  . None     Family History  Problem Relation Age of Onset  . Heart attack Father   . Hypertension Father   . Heart attack Brother   . Stroke Neg Hx      ROS:   Please see the history of present illness.    Review of Systems  Neurological: Positive for headaches.   All other systems reviewed and are negative.   EKGs/Labs/Other Test Reviewed:    EKG:  EKG is  ordered today.  The ekg ordered today demonstrates NSR, HR 76, normal axis, QTC 409 ms, no significant change from  prior tracing  Recent Labs: 03/20/2016: ALT 18; BUN 17; Creat 0.77; Potassium 4.3; Sodium 139   Recent Lipid Panel    Component Value Date/Time   CHOL 202 (H) 03/20/2016 1105   TRIG 75 03/20/2016 1105   HDL 96 03/20/2016 1105   CHOLHDL 2.1 03/20/2016 1105   VLDL 15 03/20/2016 1105   LDLCALC 91 03/20/2016 1105   LDLDIRECT 147.7 03/29/2012 0837     Physical Exam:    VS:  BP 122/62   Pulse 76   Ht  (1.676 m)   Wt 153 lb 1.9 oz (69.5 kg)   BMI 24.71 kg/m     Wt Readings from Last 3 Encounters:  12/14/16 153 lb 1.9 oz (69.5 kg)  03/20/16 144 lb 1.9 oz (65.4 kg)  09/23/15 144 lb 1.9 oz (65.4 kg)     Physical Exam  Constitutional: She is oriented to person, place, and time. She appears well-developed and well-nourished. No distress.  HENT:  Head: Normocephalic and atraumatic.  Eyes: No  scleral icterus.  Neck: Normal range of motion. No JVD present. Carotid bruit is not present (carotid upstroke is normal bilaterally).  Cardiovascular: Normal rate, regular rhythm, S1 normal and S2 normal.   Murmur heard.  Diastolic murmur is present with a grade of 1/6  at the lower left sternal border Pulmonary/Chest: Breath sounds normal. She has no wheezes. She has no rhonchi. She has no rales.  Abdominal: Soft. There is no tenderness.  Musculoskeletal: She exhibits no edema.  Neurological: She is alert and oriented to person, place, and time.  Skin: Skin is warm and dry.  Psychiatric: She has a normal mood and affect.    ASSESSMENT:    1. SVT (supraventricular tachycardia) (HCC)   2. Aortic valve insufficiency, etiology of cardiac valve disease unspecified   3. Essential hypertension   4. Other hyperlipidemia    PLAN:    In order of problems listed above:  1. SVT (supraventricular tachycardia) (HCC) -  Overall stable on current medical Rx.  Continue current regimen.   2. Aortic valve insufficiency, etiology of cardiac valve disease unspecified - Exam and history is not suggestive of worsening AI.  Last echo in 2016.  Consider repeat echo in next 1 year.   3. Essential hypertension - Controlled.  Continue current regimen.  Check BMET prior to next visit.   4. Other hyperlipidemia -  Continue statin.  Check Lipids and LFTs prior to next appointment.  Dispo:  Return in about 6 months (around 06/15/2017) for Routine Follow Up, w/ Dr. Elease Hashimoto.   Medication Adjustments/Labs and Tests Ordered: Current medicines are reviewed at length with the patient today.  Concerns regarding medicines are outlined above.  Medication changes, Labs and Tests ordered today are outlined in the Patient Instructions noted below. Patient Instructions  Medication Instructions:  No changes.   Labwork: In 6 mos (within 1 week of follow up with Dr. Delane Ginger) - BMET, Lipids,  LFTs  Testing/Procedures: None   Follow-Up: Dr. Delane Ginger in 6 months.   Any Other Special Instructions Will Be Listed Below (If Applicable).  If you need a refill on your cardiac medications before your next appointment, please call your pharmacy.   Signed, Tereso Newcomer, PA-C  12/14/2016 10:37 AM    [facility] [address] [place], [place], [place]  [address] Phone: [phone]; Fax: [phone]

## 2017-01-13 ENCOUNTER — Ambulatory Visit: Payer: Self-pay

## 2017-01-27 ENCOUNTER — Ambulatory Visit: Payer: Self-pay

## 2017-02-04 ENCOUNTER — Telehealth: Payer: Self-pay | Admitting: Cardiovascular Disease

## 2017-02-04 NOTE — Telephone Encounter (Signed)
Returned call to patient-reports palpitations and increased HR since the end of last week/over the weekend.   Reports 1-2 episodes daily since.  Denies SOB, CP, dizziness, lightheadedness.  Reports she feels fine but her husband wanted her to call and inform us since they are occurring more frequently.  Patient reports she had a very stressful we last week, her brother-in-law passed away and she thinks this may have caused her episodes to occur more frequently, reports this is the only change she can think that would contribute to this.   Patient took PRN propanolol one dose Sunday and one dose Tuesday, this "helped marginally".  This morning at 6AM BP 123/78 HR 81 and 9AM BP 120/75 HR 89, denies symptoms at current.    Hx: SVT, takes Toprol-XL 25 mg daily-PRN Propanolol 10mg  (may take 4x daily as needed).    Advised to continue to monitor BP and HR, may take up to 4 doses of PRN Propanolol and I would route to Dr. Elease HashimotoNahser for further recommendations.  Patient aware and verbalized understanding.

## 2017-02-04 NOTE — Telephone Encounter (Signed)
New message    Patient c/o Palpitations:  High priority if patient c/o lightheadedness and shortness of breath.  1. How long have you been having palpitations? Pt states pulse is racing more than she is having palps. Palps very minimal just fast pulse  2. Are you currently experiencing lightheadedness and shortness of breath? no  3. Have you checked your BP and heart rate? (document readings) bp fine, pulse 81-84 to 112-114  4. Are you experiencing any other symptoms? no

## 2017-02-04 NOTE — Telephone Encounter (Signed)
These are likely PVCs. Anxiety/ stress  is likely causing some exacerbation of these  I would continue to monitor and use propranolol as needed

## 2017-02-04 NOTE — Telephone Encounter (Signed)
Called patient to review Dr. Harvie BridgeNahser's advice. She asked how much time should pass between taking Toprol and Propranolol and I answered questions to her satisfaction. We discussed symptoms to watch for that might indicate SVT, such as tachycardia, dizziness, light-headedness, syncope. I reminded her of the valsalva maneuvers to try and to call our office or seek medical attention if SVT persists. She states she does not think she has had any SVT. She verbalized understanding and agreement with plan and thanked me for the call and Dr. Elease HashimotoNahser for his advice.

## 2017-02-11 ENCOUNTER — Ambulatory Visit
Admission: RE | Admit: 2017-02-11 | Discharge: 2017-02-11 | Disposition: A | Discharge status: Home / Self Care | Payer: BLUE CROSS/BLUE SHIELD | Source: Ambulatory Visit | Attending: Obstetrics and Gynecology | Admitting: Obstetrics and Gynecology

## 2017-02-11 DIAGNOSIS — Z1231 Encounter for screening mammogram for malignant neoplasm of breast: Secondary | ICD-10-CM

## 2017-03-01 ENCOUNTER — Other Ambulatory Visit: Payer: Self-pay | Admitting: Cardiovascular Disease

## 2017-03-01 MED ORDER — PROPRANOLOL HCL 10 MG PO TABS: 10.0000 mg | ORAL_TABLET | Freq: Four times a day (QID) | ORAL | 2 refills | Status: DC | PRN

## 2017-04-09 ENCOUNTER — Other Ambulatory Visit: Payer: Self-pay | Admitting: *Deleted

## 2017-04-09 MED ORDER — ATORVASTATIN CALCIUM 10 MG PO TABS: 10.0000 mg | ORAL_TABLET | Freq: Every day | ORAL | 2 refills | Status: DC

## 2017-04-15 DIAGNOSIS — Z124 Encounter for screening for malignant neoplasm of cervix: Secondary | ICD-10-CM | POA: Diagnosis not present

## 2017-04-20 DIAGNOSIS — Z01419 Encounter for gynecological examination (general) (routine) without abnormal findings: Secondary | ICD-10-CM | POA: Diagnosis not present

## 2017-04-20 DIAGNOSIS — Z6824 Body mass index (BMI) 24.0-24.9, adult: Secondary | ICD-10-CM | POA: Diagnosis not present

## 2017-04-20 DIAGNOSIS — Z13820 Encounter for screening for osteoporosis: Secondary | ICD-10-CM | POA: Diagnosis not present

## 2017-05-17 DIAGNOSIS — D485 Neoplasm of uncertain behavior of skin: Secondary | ICD-10-CM | POA: Diagnosis not present

## 2017-05-17 DIAGNOSIS — C44529 Squamous cell carcinoma of skin of other part of trunk: Secondary | ICD-10-CM | POA: Diagnosis not present

## 2017-06-07 DIAGNOSIS — Z23 Encounter for immunization: Secondary | ICD-10-CM | POA: Diagnosis not present

## 2017-06-15 ENCOUNTER — Telehealth: Payer: Self-pay | Admitting: *Deleted

## 2017-06-15 ENCOUNTER — Other Ambulatory Visit: Payer: BLUE CROSS/BLUE SHIELD | Admitting: *Deleted

## 2017-06-15 DIAGNOSIS — E7849 Other hyperlipidemia: Secondary | ICD-10-CM

## 2017-06-15 DIAGNOSIS — I10 Essential (primary) hypertension: Secondary | ICD-10-CM

## 2017-06-15 LAB — LIPID PANEL
CHOLESTEROL TOTAL: 193 mg/dL (ref 100–199)
Chol/HDL Ratio: 2.8 ratio (ref 0.0–4.4)
HDL: 70 mg/dL (ref >39)
LDL Calculated: 105 mg/dL — ABNORMAL HIGH (ref 0–99)
TRIGLYCERIDES: 89 mg/dL (ref 0–149)
VLDL CHOLESTEROL CAL: 18 mg/dL (ref 5–40)

## 2017-06-15 LAB — HEPATIC FUNCTION PANEL
ALT: 16 IU/L (ref 0–32)
AST: 22 IU/L (ref 0–40)
Albumin: 4.2 g/dL (ref 3.6–4.8)
Alkaline Phosphatase: 63 IU/L (ref 39–117)
BILIRUBIN, DIRECT: 0.16 mg/dL (ref 0.00–0.40)
Bilirubin Total: 0.6 mg/dL (ref 0.0–1.2)
Total Protein: 6.4 g/dL (ref 6.0–8.5)

## 2017-06-15 LAB — BASIC METABOLIC PANEL
BUN / CREAT RATIO: 21 (ref 12–28)
BUN: 14 mg/dL (ref 8–27)
CHLORIDE: 103 mmol/L (ref 96–106)
CO2: 23 mmol/L (ref 20–29)
Calcium: 9.5 mg/dL (ref 8.7–10.3)
Creatinine, Ser: 0.66 mg/dL (ref 0.57–1.00)
GFR calc non Af Amer: 96 mL/min/{1.73_m2} (ref >59)
GFR, EST AFRICAN AMERICAN: 110 mL/min/{1.73_m2} (ref >59)
Glucose: 88 mg/dL (ref 65–99)
POTASSIUM: 4.3 mmol/L (ref 3.5–5.2)
SODIUM: 140 mmol/L (ref 134–144)

## 2017-06-15 NOTE — Telephone Encounter (Signed)
Pt has bee notified of lab results by phone with verbal understanding. Pt asked it thyroid level was checked. I answered no to this. I did tell pt she could ask Dr. Elease Hashimoto when she see's him in Nov to check Thyroid level. Last TSH in our system was 08/13/15. Pt thanked me for my call.

## 2017-06-15 NOTE — Telephone Encounter (Signed)
-----   Message from Beatrice Lecher, [place] sent at 06/15/2017  5:12 PM EDT ----- Please call the patient Kidney function, liver function normal.  Lipids are optimal.  Continue current medications and follow-up as planned. Tereso Newcomer, PA-C    06/15/2017 5:12 PM

## 2017-06-29 DIAGNOSIS — C44529 Squamous cell carcinoma of skin of other part of trunk: Secondary | ICD-10-CM | POA: Diagnosis not present

## 2017-06-29 DIAGNOSIS — L905 Scar conditions and fibrosis of skin: Secondary | ICD-10-CM | POA: Diagnosis not present

## 2017-07-16 ENCOUNTER — Ambulatory Visit: Payer: BLUE CROSS/BLUE SHIELD | Admitting: Cardiovascular Disease

## 2017-07-28 DIAGNOSIS — L821 Other seborrheic keratosis: Secondary | ICD-10-CM | POA: Diagnosis not present

## 2017-07-28 DIAGNOSIS — D2271 Melanocytic nevi of right lower limb, including hip: Secondary | ICD-10-CM | POA: Diagnosis not present

## 2017-07-28 DIAGNOSIS — L814 Other melanin hyperpigmentation: Secondary | ICD-10-CM | POA: Diagnosis not present

## 2017-07-28 DIAGNOSIS — D1801 Hemangioma of skin and subcutaneous tissue: Secondary | ICD-10-CM | POA: Diagnosis not present

## 2017-09-07 ENCOUNTER — Other Ambulatory Visit: Payer: Self-pay | Admitting: Cardiovascular Disease

## 2017-10-07 ENCOUNTER — Encounter: Payer: Self-pay | Admitting: Cardiovascular Disease

## 2017-10-07 ENCOUNTER — Ambulatory Visit: Payer: BLUE CROSS/BLUE SHIELD | Admitting: Cardiovascular Disease

## 2017-10-07 VITALS — BP 134/74 | HR 82 | Ht 67.0 in | Wt 155.8 lb

## 2017-10-07 DIAGNOSIS — I471 Supraventricular tachycardia: Secondary | ICD-10-CM | POA: Diagnosis not present

## 2017-10-07 DIAGNOSIS — I351 Nonrheumatic aortic (valve) insufficiency: Secondary | ICD-10-CM | POA: Diagnosis not present

## 2017-10-07 DIAGNOSIS — Z13220 Encounter for screening for lipoid disorders: Secondary | ICD-10-CM

## 2017-10-07 NOTE — Progress Notes (Signed)
Sheri Montgomery Date of Birth  Apr 09, 1956       [facility] Office [address]  [address] [place], [place]  [address]   [place], [place]  [address] [phone]     [phone]   Fax  [phone]    Fax [phone]  Problem List: 1. hyperlipidemia 2. Chest pain 3. Hypertension - now controlled with diet.   History of Present Illness:  Sheri Montgomery is doing well.  She has been busy taking care of her elderly father.  She has not had any cardiac problems.  She is walking several days a week.   March 29, 2013:  Sheri Montgomery is doing well.  Exercising on occasion.  She is not tolerating the niacin.    Trying to stick to a good diet.   05/10/2014:    Sheri Montgomery is doing ok.  Still busy taking care of her father (lives in [place])  She was walking regularly until 2 months ago when she injurred her foot.    Sept. 13, 2016:  Sheri Montgomery is doing well.  Is now on Phentermine - HR is faster .   She feels like her system is "Revved up "   No CP , doing well.   09/15/2015 Sheri Montgomery has passed out twice  Has been on Phentermine.   Cut the dose in 1/2 and then to 1/4 tablet.  On Nov. 16, was standing in the kitchen,  Had a split second warning and then passed out. Has been dieting ,  Had not been eating much ( but no less that usual )  Quit Phenermine on that day  Several weeks later,  ( Dec. 10)  She was again standin in the kitchen, felt her hear racing. Could not tell if her HR was regular or irregular . Took her HR and BP  Multiple times for the next hour   Dec. 10, 2016  9:29 203/183 -   HR = 178 9:40           HR  Of 184 9:52 120 /78 HR 123 10:16 120/83  HR 129 11:46 147/94  HR119 1:51 134/88  HR 112 3:48 140/92  HR 115 9:31 112/72  HR 101   Rested for about 30 minutes. Felt normal about 1 hr later.    Has noticed more DOE for the past several weeks.   Jan. 23, 2017: Sheri Montgomery is doing ok.   Had at least 2 episodes of SVT on the  monitor . Was started on metoprolol XL 25 mg a day.   Did not want to increase metoprolol but added Propranolol to her low dose metoprolol .  Stress myoview was normal - no ischemia, normal LV function .   March 20, 2016  Doing ok.   Takes Toprol XL 25 mg after lunch Notices that her HR is faster in the afternoon.  Needs to walk more   Feb. 7, 2019:  Doing well.   Rare occasionaal episodes of SVT. Seems to be when the Toprol is wearing off.   Takes propranolol which takes care of it    Current Outpatient Medications on File Prior to Visit  Medication Sig Dispense Refill  . aspirin 81 MG tablet Take 81 mg by mouth daily.    Marland Kitchen atorvastatin (LIPITOR) 10 MG tablet Take 1 tablet (10 mg total) by mouth daily. 90 tablet 2  . Cholecalciferol (VITAMIN D PO) Take 1 tablet by mouth  daily.     . docusate sodium (COLACE) 50 MG capsule Take 50 mg by mouth 2 (two) times daily as needed for mild constipation.    . metoprolol succinate (TOPROL-XL) 25 MG 24 hr tablet TAKE 1 TABLET BY MOUTH DAILY 90 tablet 0  . Naproxen Sodium (ALEVE PO) Take 1 tablet by mouth as needed (AS NEEDED FOR MILD PAIN).     . NON FORMULARY Take 1,000 mg by mouth 2 (two) times daily. Viactive (Calcium)    . propranolol (INDERAL) 10 MG tablet Take 1 tablet (10 mg total) by mouth 4 (four) times daily as needed (fast heart rate and/or palpitations). 360 tablet 2  . ranitidine (ZANTAC) 75 MG tablet Take 75 mg by mouth daily as needed for heartburn.     No current facility-administered medications on file prior to visit.     Allergies  Allergen Reactions  . Crestor [Rosuvastatin]     Causes muscle aches     Past Medical History:  Diagnosis Date  . Chest pain   . Hypercholesterolemia   . Hypertension     Past Surgical History:  Procedure Laterality Date  . ANKLE SURGERY    . TONSILLECTOMY      Social History   Tobacco Use  Smoking Status Never Smoker  Smokeless Tobacco Never Used    Social History    Substance and Sexual Activity  Alcohol Use Yes    Family History  Problem Relation Age of Onset  . Heart attack Father   . Hypertension Father   . Heart attack Brother   . Stroke Neg Hx   . Breast cancer Neg Hx     Reviw of Systems:  Reviewed,  All other symptoms are negative   Physical Exam: Blood pressure 134/74, pulse 82, height 5\' 7"  (1.702 m), weight 155 lb 12.8 oz (70.7 kg).  GEN:  Well nourished, well developed in no acute distress HEENT: Normal NECK: No JVD; No carotid bruits LYMPHATICS: No lymphadenopathy CARDIAC: RR, no significant murmur  RESPIRATORY:  Clear to auscultation without rales, wheezing or rhonchi  ABDOMEN: Soft, non-tender, non-distended MUSCULOSKELETAL:  No edema; No deformity  SKIN: Warm and dry NEUROLOGIC:  Alert and oriented x 3  ECG: 27, 2019: Normal sinus rhythm at 82.  She has no ST or T wave changes.  Assessment / Plan:   1.   SVT -Sheri Montgomery is doing well.  She has rare episodes of SVT-perhaps 3 or 4 times a year.  These resolved fairly quickly after taking a propranolol. We discussed the fact that this sounds very stable.  We also discussed the possibility of doing SVT ablation.  At this point I think that she is tolerating these episodes well and I do not think that seh  need to have a SVT ablation at this point.  2. Aortic insufficiency -  Mild AI,      3. Hypertension -   BP is well controlled.   4. Syncope:   .no furturther episodes of syncope  5. Hyperlipidemia -   Lipids are well controlled .  Continue Atrovastatin 10 mg a day   Sheri MissPhilip Jo Booze, MD  10/07/2017 9:32 AM    [facility] [address],  [address] [place], [place]  [address] Pager [phone] Phone: [phone]; Fax: [phone]

## 2017-10-07 NOTE — Patient Instructions (Signed)
Medication Instructions:  Your physician recommends that you continue on your current medications as directed. Please refer to the Current Medication list given to you today.   Labwork: Your physician recommends that you return for lab work (TSH, BMET, Liver, Cholesterol) in: 1 year on the day of or a few days before your office visit with Dr. Elease HashimotoNahser.  You will need to FAST for this appointment - nothing to eat or drink after midnight the night before except water.   Testing/Procedures: None Ordered   Follow-Up: Your physician wants you to follow-up in: 1 year with Dr. Elease HashimotoNahser.  You will receive a reminder letter in the mail two months in advance. If you don't receive a letter, please call our office to schedule the follow-up appointment.   If you need a refill on your cardiac medications before your next appointment, please call your pharmacy.   Thank you for choosing [facility]! Eligha BridegroomMichelle Eliese Kerwood, RN [phone]

## 2017-10-19 ENCOUNTER — Telehealth: Payer: Self-pay | Admitting: Cardiovascular Disease

## 2017-10-19 NOTE — Telephone Encounter (Signed)
Pt has been having trouble with muscle aches for awhile now.  She thought it was arthritis but spoke with some people and did some research and thought it might be her statin medication causing this.  Pt wanting to know if she could possibly come off of it for a little bit to see if any improvement.  Advised I will send to Dr. Elease HashimotoNahser for review.

## 2017-10-19 NOTE — Telephone Encounter (Signed)
This might be due to the atorvastatin Have her stop the atorvastatin for 2-3 weeks and see if the symptoms clear up We can try Rosuvastatin next  Will consider referral to Lipid clinic if needed.

## 2017-10-19 NOTE — Telephone Encounter (Signed)
New message    Patient calling with concerns of being muscle aches all over. Patient thinks this may be coming from atorvastatin (LIPITOR) 10 MG tablet.   Pt c/o medication issue:  1. Name of Medication: atorvastatin (LIPITOR) 10 MG tablet  2. How are you currently taking this medication (dosage and times per day)? Take 1 tablet (10 mg total) by mouth daily.  3. Are you having a reaction (difficulty breathing--STAT)? No  4. What is your medication issue? Muscle/body ache

## 2017-10-20 NOTE — Telephone Encounter (Signed)
Spoke with patient and advised that per Dr. Elease HashimotoNahser, she may hold Atorvastatin for 2-3 weeks to see if her symptoms improve. I advised her to call back to report how she is feeling at the end of that trial. She verbalized understanding and agreement with the plan and thanked me for the call.

## 2017-11-11 NOTE — Telephone Encounter (Signed)
New message   Pt is calling to report how she is feeling after stopping the atorvastatin for 3 weeks. Please call

## 2017-11-12 NOTE — Telephone Encounter (Signed)
Spoke with patient who states she has held Lipitor for 3 weeks and she cannot tell a significant difference in her symptoms. She states she took Crestor in the past and had muscle aches; she states she does not want to try taking it again. I advised that I have heard our pharmacist explain to a patient recently that it may take up to 6 weeks for the medication to be out of her system so she may continue to hold the Lipitor for 3 more weeks to see how she feels. She is agreeable to plan and will call back in 3 weeks. She thanked me for the call.

## 2017-12-01 DIAGNOSIS — M1711 Unilateral primary osteoarthritis, right knee: Secondary | ICD-10-CM | POA: Diagnosis not present

## 2017-12-09 ENCOUNTER — Other Ambulatory Visit: Payer: Self-pay | Admitting: Cardiovascular Disease

## 2017-12-10 ENCOUNTER — Telehealth: Payer: Self-pay | Admitting: Cardiovascular Disease

## 2017-12-10 NOTE — Telephone Encounter (Signed)
Please ask her to restart her Atorvastatin and see if her muscle aches return.

## 2017-12-10 NOTE — Telephone Encounter (Signed)
Spoke with pt and made her aware of Dr. Harvie BridgeNahser's recommendation.  Pt verbalized understanding and was in agreement with this plan.

## 2017-12-10 NOTE — Telephone Encounter (Signed)
Pt has been holding Lipitor for 6 wks d/t aching muscles.  Pt states sx are a "tiny bit" better.  Pt does have arthritis and had fluid removed from her knee so knee is better.  Pt unsure if Lipitor was the problem or if it is just her arthritis.  Advised I would send message to Dr. Elease HashimotoNahser to see if he would like for pt to go back on her Atorvastatin 10mg  QD?

## 2017-12-10 NOTE — Telephone Encounter (Signed)
New Message  Pt c/o medication issue:  1. Name of Medication: atorvastatin (LIPITOR) 10 MG tablet  2. How are you currently taking this medication (dosage and times per day)? Take 1 tablet (10 mg total) by mouth daily. Patient taking differently: Take 10 mg by mouth daily  3. Are you having a reaction (difficulty breathing--STAT)? no  4. What is your medication issue? Pt states she was told to hold her Lipitor for 6 weeks and wants to know what she should do next. Please call

## 2017-12-13 NOTE — Telephone Encounter (Signed)
See follow-up message dated 12/10/17

## 2018-01-12 DIAGNOSIS — M1711 Unilateral primary osteoarthritis, right knee: Secondary | ICD-10-CM | POA: Diagnosis not present

## 2018-01-19 DIAGNOSIS — M1711 Unilateral primary osteoarthritis, right knee: Secondary | ICD-10-CM | POA: Diagnosis not present

## 2018-01-26 DIAGNOSIS — M1711 Unilateral primary osteoarthritis, right knee: Secondary | ICD-10-CM | POA: Diagnosis not present

## 2018-02-21 DIAGNOSIS — R05 Cough: Secondary | ICD-10-CM | POA: Diagnosis not present

## 2018-02-22 ENCOUNTER — Other Ambulatory Visit: Payer: Self-pay | Admitting: Cardiovascular Disease

## 2018-03-14 DIAGNOSIS — M1711 Unilateral primary osteoarthritis, right knee: Secondary | ICD-10-CM | POA: Diagnosis not present

## 2018-03-28 ENCOUNTER — Telehealth: Payer: Self-pay | Admitting: Cardiovascular Disease

## 2018-03-28 NOTE — Telephone Encounter (Signed)
Spoke with patient and advised her that per Dr. Elease HashimotoNahser, she may use the diclofenac gel. I answered her questions about BP monitoring and she verbalized understanding and thanked me for the call.

## 2018-03-28 NOTE — Telephone Encounter (Signed)
New message    Pt c/o medication issue:  1. Name of Medication: Diclofenac gel  2. How are you currently taking this medication (dosage and times per day)?   3. Are you having a reaction (difficulty breathing--STAT)? No  4. What is your medication issue? Patient wants to know to if gel is safe to use with heart meds

## 2018-03-30 ENCOUNTER — Other Ambulatory Visit: Payer: Self-pay | Admitting: Obstetrics and Gynecology

## 2018-03-30 ENCOUNTER — Other Ambulatory Visit: Payer: Self-pay | Admitting: Cardiovascular Disease

## 2018-03-30 DIAGNOSIS — Z1231 Encounter for screening mammogram for malignant neoplasm of breast: Secondary | ICD-10-CM

## 2018-04-26 ENCOUNTER — Ambulatory Visit: Payer: BLUE CROSS/BLUE SHIELD

## 2018-05-12 ENCOUNTER — Ambulatory Visit
Admission: RE | Admit: 2018-05-12 | Discharge: 2018-05-12 | Disposition: A | Discharge status: Home / Self Care | Payer: BLUE CROSS/BLUE SHIELD | Source: Ambulatory Visit | Attending: Obstetrics and Gynecology | Admitting: Obstetrics and Gynecology

## 2018-05-12 DIAGNOSIS — Z1231 Encounter for screening mammogram for malignant neoplasm of breast: Secondary | ICD-10-CM | POA: Diagnosis not present

## 2018-05-12 DIAGNOSIS — Z6824 Body mass index (BMI) 24.0-24.9, adult: Secondary | ICD-10-CM | POA: Diagnosis not present

## 2018-05-12 DIAGNOSIS — Z01419 Encounter for gynecological examination (general) (routine) without abnormal findings: Secondary | ICD-10-CM | POA: Diagnosis not present

## 2018-05-16 ENCOUNTER — Other Ambulatory Visit: Payer: Self-pay | Admitting: Obstetrics and Gynecology

## 2018-05-16 DIAGNOSIS — R928 Other abnormal and inconclusive findings on diagnostic imaging of breast: Secondary | ICD-10-CM

## 2018-05-18 ENCOUNTER — Ambulatory Visit: Payer: BLUE CROSS/BLUE SHIELD

## 2018-05-18 ENCOUNTER — Ambulatory Visit
Admission: RE | Admit: 2018-05-18 | Discharge: 2018-05-18 | Disposition: A | Discharge status: Home / Self Care | Payer: BLUE CROSS/BLUE SHIELD | Source: Ambulatory Visit | Attending: Obstetrics and Gynecology | Admitting: Obstetrics and Gynecology

## 2018-05-18 DIAGNOSIS — R928 Other abnormal and inconclusive findings on diagnostic imaging of breast: Secondary | ICD-10-CM | POA: Diagnosis not present

## 2018-06-15 DIAGNOSIS — Z23 Encounter for immunization: Secondary | ICD-10-CM | POA: Diagnosis not present

## 2018-06-21 DIAGNOSIS — M1711 Unilateral primary osteoarthritis, right knee: Secondary | ICD-10-CM | POA: Diagnosis not present

## 2018-07-14 DIAGNOSIS — L814 Other melanin hyperpigmentation: Secondary | ICD-10-CM | POA: Diagnosis not present

## 2018-07-14 DIAGNOSIS — L821 Other seborrheic keratosis: Secondary | ICD-10-CM | POA: Diagnosis not present

## 2018-07-14 DIAGNOSIS — Z86018 Personal history of other benign neoplasm: Secondary | ICD-10-CM | POA: Diagnosis not present

## 2018-07-14 DIAGNOSIS — D2271 Melanocytic nevi of right lower limb, including hip: Secondary | ICD-10-CM | POA: Diagnosis not present

## 2018-08-06 ENCOUNTER — Other Ambulatory Visit: Payer: Self-pay | Admitting: Cardiovascular Disease

## 2018-09-05 DIAGNOSIS — M1711 Unilateral primary osteoarthritis, right knee: Secondary | ICD-10-CM | POA: Diagnosis not present

## 2018-09-22 ENCOUNTER — Encounter: Payer: Self-pay | Admitting: Cardiovascular Disease

## 2018-10-11 ENCOUNTER — Other Ambulatory Visit: Payer: BLUE CROSS/BLUE SHIELD

## 2018-10-11 DIAGNOSIS — I471 Supraventricular tachycardia: Secondary | ICD-10-CM | POA: Diagnosis not present

## 2018-10-11 DIAGNOSIS — Z13220 Encounter for screening for lipoid disorders: Secondary | ICD-10-CM | POA: Diagnosis not present

## 2018-10-11 DIAGNOSIS — I351 Nonrheumatic aortic (valve) insufficiency: Secondary | ICD-10-CM

## 2018-10-11 LAB — HEPATIC FUNCTION PANEL
ALBUMIN: 4.3 g/dL (ref 3.8–4.8)
ALT: 21 IU/L (ref 0–32)
AST: 22 IU/L (ref 0–40)
Alkaline Phosphatase: 69 IU/L (ref 39–117)
Bilirubin Total: 0.7 mg/dL (ref 0.0–1.2)
Bilirubin, Direct: 0.17 mg/dL (ref 0.00–0.40)
TOTAL PROTEIN: 6.3 g/dL (ref 6.0–8.5)

## 2018-10-11 LAB — LIPID PANEL
Chol/HDL Ratio: 2.3 ratio (ref 0.0–4.4)
Cholesterol, Total: 215 mg/dL — ABNORMAL HIGH (ref 100–199)
HDL: 92 mg/dL (ref >39)
LDL Calculated: 105 mg/dL — ABNORMAL HIGH (ref 0–99)
Triglycerides: 91 mg/dL (ref 0–149)
VLDL Cholesterol Cal: 18 mg/dL (ref 5–40)

## 2018-10-11 LAB — BASIC METABOLIC PANEL
BUN/Creatinine Ratio: 15 (ref 12–28)
BUN: 12 mg/dL (ref 8–27)
CO2: 24 mmol/L (ref 20–29)
Calcium: 9.3 mg/dL (ref 8.7–10.3)
Chloride: 98 mmol/L (ref 96–106)
Creatinine, Ser: 0.8 mg/dL (ref 0.57–1.00)
GFR calc Af Amer: 91 mL/min/{1.73_m2} (ref >59)
GFR, EST NON AFRICAN AMERICAN: 79 mL/min/{1.73_m2} (ref >59)
Glucose: 89 mg/dL (ref 65–99)
Potassium: 4.6 mmol/L (ref 3.5–5.2)
Sodium: 139 mmol/L (ref 134–144)

## 2018-10-11 LAB — TSH: TSH: 3.24 u[IU]/mL (ref 0.450–4.500)

## 2018-10-12 ENCOUNTER — Encounter: Payer: Self-pay | Admitting: Cardiovascular Disease

## 2018-10-12 ENCOUNTER — Ambulatory Visit: Payer: BLUE CROSS/BLUE SHIELD | Admitting: Cardiovascular Disease

## 2018-10-12 VITALS — BP 118/74 | HR 83 | Ht 67.0 in | Wt 153.0 lb

## 2018-10-12 DIAGNOSIS — I471 Supraventricular tachycardia, unspecified: Secondary | ICD-10-CM

## 2018-10-12 DIAGNOSIS — H01001 Unspecified blepharitis right upper eyelid: Secondary | ICD-10-CM | POA: Diagnosis not present

## 2018-10-12 DIAGNOSIS — I351 Nonrheumatic aortic (valve) insufficiency: Secondary | ICD-10-CM

## 2018-10-12 DIAGNOSIS — H5213 Myopia, bilateral: Secondary | ICD-10-CM | POA: Diagnosis not present

## 2018-10-12 DIAGNOSIS — H01004 Unspecified blepharitis left upper eyelid: Secondary | ICD-10-CM | POA: Diagnosis not present

## 2018-10-12 DIAGNOSIS — H524 Presbyopia: Secondary | ICD-10-CM | POA: Diagnosis not present

## 2018-10-12 DIAGNOSIS — I10 Essential (primary) hypertension: Secondary | ICD-10-CM

## 2018-10-12 NOTE — Progress Notes (Signed)
Sheri Montgomery Date of Birth  1955/11/10       [facility]    [place] [facility] [address], [address]  [address], [address] [place], [place]  [address]   [place], [place]  [id number] [phone]     [phone]   Fax  [phone]    Fax [phone]  Problem List: 1. hyperlipidemia 2. Chest pain 3. Hypertension - now controlled with diet.   History of Present Illness:  Sheri AspCindy is doing well.  She has been busy taking care of her elderly father.  She has not had any cardiac problems.  She is walking several days a week.   March 29, 2013:  Sheri AspCindy is doing well.  Exercising on occasion.  She is not tolerating the niacin.    Trying to stick to a good diet.   05/10/2014:    Sheri AspCindy is doing ok.  Still busy taking care of her father (lives in [place])  She was walking regularly until 2 months ago when she injurred her foot.    Sept. 13, 2016:  Sheri AspCindy is doing well.  Is now on Phentermine - HR is faster .   She feels like her system is "Revved up "   No CP , doing well.   Dec. 20, 2016 Sheri AspCindy has passed out twice  Has been on Phentermine.   Cut the dose in 1/2 and then to 1/4 tablet.  On Nov. 16, was standing in the kitchen,  Had a split second warning and then passed out. Has been dieting ,  Had not been eating much ( but no less that usual )  Quit Phenermine on that day  Several weeks later,  ( Dec. 10)  She was again standin in the kitchen, felt her hear racing. Could not tell if her HR was regular or irregular . Took her HR and BP  Multiple times for the next hour   Dec. 10, 2016  9:29 203/183 -   HR = 178 9:40           HR  Of 184 9:52 120 /78 HR 123 10:16 120/83  HR 129 11:46 147/94  HR119 1:51 134/88  HR 112 3:48 140/92  HR 115 9:31 112/72  HR 101   Rested for about 30 minutes. Felt normal about 1 hr later.    Has noticed more DOE for the past several weeks.   Jan. 23, 2017: Sheri AspCindy is doing ok.   Had at least 2 episodes of SVT on the  monitor . Was started on metoprolol XL 25 mg a day.   Did not want to increase metoprolol but added Propranolol to her low dose metoprolol .  Stress myoview was normal - no ischemia, normal LV function .   March 20, 2016  Doing ok.   Takes Toprol XL 25 mg after lunch Notices that her HR is faster in the afternoon.  Needs to walk more   Feb. 7, 2019:  Doing well.   Rare occasionaal episodes of SVT. Seems to be when the Toprol is wearing off.   Takes propranolol which takes care of it   October 12, 2018: He is doing very well.  She is seen for follow-up of episodes of supraventricular tachycardia and mild hyperlipidemia.  Also has mild aortic insufficiency. Has rare episodes of SVT  - typically 4-5 each month  typcially occurs after lunch .   These resolve   Current Outpatient Medications on File Prior to  Visit  Medication Sig Dispense Refill  . aspirin 81 MG tablet Take 81 mg by mouth daily.    Marland Kitchen atorvastatin (LIPITOR) 10 MG tablet TAKE 1 TABLET BY MOUTH DAILY 90 tablet 2  . Cholecalciferol (VITAMIN D PO) Take 1 tablet by mouth daily.     Marland Kitchen docusate sodium (COLACE) 50 MG capsule Take 50 mg by mouth 2 (two) times daily as needed for mild constipation.    . metoprolol succinate (TOPROL-XL) 25 MG 24 hr tablet Take 1 tablet (25 mg total) by mouth daily. Please keep upcoming appt in February for future refills. Thank you 90 tablet 0  . Naproxen Sodium (ALEVE PO) Take 1 tablet by mouth as needed (AS NEEDED FOR MILD PAIN).     . NON FORMULARY Take 1,000 mg by mouth 2 (two) times daily. Viactive (Calcium)    . propranolol (INDERAL) 10 MG tablet TAKE ONE TABLET BY MOUTH FOUR TIMES DAILY AS NEEDED FOR FAST HEART RATE AND/OR PALPITATIONS. 360 tablet 1   No current facility-administered medications on file prior to visit.     Allergies  Allergen Reactions  . Crestor [Rosuvastatin]     Causes muscle aches     Past Medical History:  Diagnosis Date  . Chest pain   . Hypercholesterolemia    . Hypertension     Past Surgical History:  Procedure Laterality Date  . ANKLE SURGERY    . TONSILLECTOMY      Social History   Tobacco Use  Smoking Status Never Smoker  Smokeless Tobacco Never Used    Social History   Substance and Sexual Activity  Alcohol Use Yes    Family History  Problem Relation Age of Onset  . Heart attack Father   . Hypertension Father   . Heart attack Brother   . Stroke Neg Hx   . Breast cancer Neg Hx     Reviw of Systems:  Reviewed,  All other symptoms are negative   Physical Exam: Blood pressure 118/74, pulse 83, height 5\' 7"  (1.702 m), weight 153 lb (69.4 kg), SpO2 98 %.  GEN:  Well nourished, well developed in no acute distress HEENT: Normal NECK: No JVD; No carotid bruits LYMPHATICS: No lymphadenopathy CARDIAC: RRR , soft systolic and diastolic murmur s RESPIRATORY:  Clear to auscultation without rales, wheezing or rhonchi  ABDOMEN: Soft, non-tender, non-distended MUSCULOSKELETAL:  No edema; No deformity  SKIN: Warm and dry NEUROLOGIC:  Alert and oriented x 3   ECG: October 12, 2018: Normal sinus rhythm at 83.  No ST or T wave changes.  The EKG is normal.  Assessment / Plan:   1.   SVT -still has occasional episodes of tachycardia. She takes propranolol with some relief.  2. Aortic insufficiency -     soft systolic and diastolic murmur.  Her aortic insufficiency sounds stable.    3. Hypertension -     pressure is well controlled.    5. Hyperlipidemia -     Lipid levels look good.  Continue atorvastatin 10 mg a day.  Sheri Miss, MD  10/12/2018 9:17 AM    [facility] [address] [place],  [address] [place], [place]  [address] Pager [phone] Phone: [phone]; Fax: [phone]

## 2018-10-12 NOTE — Patient Instructions (Addendum)
Medication Instructions:  Your provider recommends that you continue on your current medications as directed. Please refer to the Current Medication list given to you today.    Labwork: Your provider recommends that you return for fasting lab work in 1 year.  Testing/Procedures: None  Follow-Up: Your provider wants you to follow-up in: 1 year with Dr. Elease Hashimoto. You will receive a reminder letter in the mail two months in advance. If you don't receive a letter, please call our office to schedule the follow-up appointment.    Any Other Special Instructions Will Be Listed Below (If Applicable). Consider getting a Kardia heart monitor by [employer]

## 2018-10-13 DIAGNOSIS — Z1212 Encounter for screening for malignant neoplasm of rectum: Secondary | ICD-10-CM | POA: Diagnosis not present

## 2018-10-13 DIAGNOSIS — Z1211 Encounter for screening for malignant neoplasm of colon: Secondary | ICD-10-CM | POA: Diagnosis not present

## 2018-11-01 ENCOUNTER — Other Ambulatory Visit: Payer: Self-pay | Admitting: Cardiovascular Disease

## 2018-11-23 ENCOUNTER — Other Ambulatory Visit: Payer: Self-pay | Admitting: Cardiovascular Disease

## 2019-02-06 DIAGNOSIS — M1711 Unilateral primary osteoarthritis, right knee: Secondary | ICD-10-CM | POA: Diagnosis not present

## 2019-04-18 ENCOUNTER — Other Ambulatory Visit: Payer: Self-pay | Admitting: Cardiovascular Disease

## 2019-04-18 MED ORDER — METOPROLOL SUCCINATE ER 25 MG PO TB24: 25.0000 mg | ORAL_TABLET | Freq: Every day | ORAL | 1 refills | Status: DC

## 2019-04-18 MED ORDER — PROPRANOLOL HCL 10 MG PO TABS: ORAL_TABLET | ORAL | 1 refills | Status: DC

## 2019-04-18 MED ORDER — ATORVASTATIN CALCIUM 10 MG PO TABS: 10.0000 mg | ORAL_TABLET | Freq: Every day | ORAL | 1 refills | Status: DC

## 2019-04-18 NOTE — Telephone Encounter (Signed)
Pt's medications were sent to pt's pharmacy as requested. Confirmation received.  

## 2019-08-16 DIAGNOSIS — Z03818 Encounter for observation for suspected exposure to other biological agents ruled out: Secondary | ICD-10-CM | POA: Diagnosis not present

## 2019-09-22 DIAGNOSIS — Z0000 Encounter for general adult medical examination without abnormal findings: Secondary | ICD-10-CM | POA: Diagnosis not present

## 2019-09-22 DIAGNOSIS — Z1159 Encounter for screening for other viral diseases: Secondary | ICD-10-CM | POA: Diagnosis not present

## 2019-10-03 ENCOUNTER — Other Ambulatory Visit: Payer: Self-pay | Admitting: Obstetrics and Gynecology

## 2019-10-03 DIAGNOSIS — Z1231 Encounter for screening mammogram for malignant neoplasm of breast: Secondary | ICD-10-CM

## 2019-10-03 DIAGNOSIS — I471 Supraventricular tachycardia: Secondary | ICD-10-CM | POA: Diagnosis not present

## 2019-10-03 DIAGNOSIS — Z0000 Encounter for general adult medical examination without abnormal findings: Secondary | ICD-10-CM | POA: Diagnosis not present

## 2019-10-06 ENCOUNTER — Other Ambulatory Visit: Payer: BLUE CROSS/BLUE SHIELD

## 2019-10-09 ENCOUNTER — Ambulatory Visit
Admission: RE | Admit: 2019-10-09 | Discharge: 2019-10-09 | Disposition: A | Discharge status: Home / Self Care | Payer: BC Managed Care – PPO | Source: Ambulatory Visit | Attending: Obstetrics and Gynecology | Admitting: Obstetrics and Gynecology

## 2019-10-09 ENCOUNTER — Other Ambulatory Visit: Payer: Self-pay

## 2019-10-09 DIAGNOSIS — Z1231 Encounter for screening mammogram for malignant neoplasm of breast: Secondary | ICD-10-CM

## 2019-10-10 DIAGNOSIS — F419 Anxiety disorder, unspecified: Secondary | ICD-10-CM | POA: Diagnosis not present

## 2019-10-10 DIAGNOSIS — Z01419 Encounter for gynecological examination (general) (routine) without abnormal findings: Secondary | ICD-10-CM | POA: Diagnosis not present

## 2019-10-10 DIAGNOSIS — L821 Other seborrheic keratosis: Secondary | ICD-10-CM | POA: Diagnosis not present

## 2019-10-10 DIAGNOSIS — L814 Other melanin hyperpigmentation: Secondary | ICD-10-CM | POA: Diagnosis not present

## 2019-10-10 DIAGNOSIS — Z86018 Personal history of other benign neoplasm: Secondary | ICD-10-CM | POA: Diagnosis not present

## 2019-10-10 DIAGNOSIS — Z6824 Body mass index (BMI) 24.0-24.9, adult: Secondary | ICD-10-CM | POA: Diagnosis not present

## 2019-10-10 DIAGNOSIS — Z13820 Encounter for screening for osteoporosis: Secondary | ICD-10-CM | POA: Diagnosis not present

## 2019-10-10 DIAGNOSIS — D2271 Melanocytic nevi of right lower limb, including hip: Secondary | ICD-10-CM | POA: Diagnosis not present

## 2019-10-11 ENCOUNTER — Other Ambulatory Visit: Payer: Self-pay

## 2019-10-11 ENCOUNTER — Encounter: Payer: Self-pay | Admitting: Cardiovascular Disease

## 2019-10-11 ENCOUNTER — Ambulatory Visit (INDEPENDENT_AMBULATORY_CARE_PROVIDER_SITE_OTHER): Payer: BC Managed Care – PPO | Admitting: Cardiovascular Disease

## 2019-10-11 VITALS — BP 120/76 | HR 86 | Ht 66.5 in | Wt 154.0 lb

## 2019-10-11 DIAGNOSIS — I351 Nonrheumatic aortic (valve) insufficiency: Secondary | ICD-10-CM

## 2019-10-11 DIAGNOSIS — I77819 Aortic ectasia, unspecified site: Secondary | ICD-10-CM

## 2019-10-11 DIAGNOSIS — I10 Essential (primary) hypertension: Secondary | ICD-10-CM | POA: Diagnosis not present

## 2019-10-11 DIAGNOSIS — M1711 Unilateral primary osteoarthritis, right knee: Secondary | ICD-10-CM | POA: Diagnosis not present

## 2019-10-11 NOTE — Patient Instructions (Signed)
Medication Instructions:  1) DISCONTINUE Aspirin  *If you need a refill on your cardiac medications before your next appointment, please call your pharmacy*  Lab Work: None If you have labs (blood work) drawn today and your tests are completely normal, you will receive your results only by: Marland Kitchen MyChart Message (if you have MyChart) OR . A paper copy in the mail If you have any lab test that is abnormal or we need to change your treatment, we will call you to review the results.  Testing/Procedures: Your physician recommends that you have a Chest CT Angio performed.  Your physician has requested that you have an echocardiogram in 1 year. Echocardiography is a painless test that uses sound waves to create images of your heart. It provides your doctor with information about the size and shape of your heart and how well your heart's chambers and valves are working. This procedure takes approximately one hour. There are no restrictions for this procedure.    Follow-Up: At [facility], you and your health needs are our priority.  As part of our continuing mission to provide you with exceptional heart care, we have created designated Provider Care Teams.  These Care Teams include your primary Cardiologist (physician) and Advanced Practice Providers (APPs -  Physician Assistants and Nurse Practitioners) who all work together to provide you with the care you need, when you need it.  Your next appointment:   12 month(s)  The format for your next appointment:   In Person  Provider:   You may see Kristeen Miss, MD or one of the following Advanced Practice Providers on your designated Care Team:    Tereso Newcomer, PA-C  Vin [place], [place]  Berton Bon, NP   Other Instructions

## 2019-10-11 NOTE — Progress Notes (Signed)
Sheri Montgomery Date of Birth  01/09/1956       [facility] [address]  [address] [place], [place]  [address]   [place], [place]  [address] [phone]     [phone]   Fax  [phone]    Fax [phone]  Problem List: 1. hyperlipidemia 2. Chest pain 3. Hypertension - now controlled with diet.   History of Present Illness:  Sheri Montgomery is doing well.  She has been busy taking care of her elderly father.  She has not had any cardiac problems.  She is walking several days a week.   March 29, 2013:  Sheri Montgomery is doing well.  Exercising on occasion.  She is not tolerating the niacin.    Trying to stick to a good diet.   05/10/2014:    Sheri Montgomery is doing ok.  Still busy taking care of her father (lives in [place])  She was walking regularly until 2 months ago when she injurred her foot.    Sept. 13, 2016:  Sheri Montgomery is doing well.  Is now on Phentermine - HR is faster .   She feels like her system is "Revved up "   No CP , doing well.   2015/08/27 Sheri Montgomery has passed out twice  Has been on Phentermine.   Cut the dose in 1/2 and then to 1/4 tablet.  On Nov. 16, was standing in the kitchen,  Had a split second warning and then passed out. Has been dieting ,  Had not been eating much ( but no less that usual )  Quit Phenermine on that day  Several weeks later,  ( Dec. 10)  She was again standin in the kitchen, felt her hear racing. Could not tell if her HR was regular or irregular . Took her HR and BP  Multiple times for the next hour   Dec. 10, 2016  9:29 203/183 -   HR = 178 9:40           HR  Of 184 9:52 120 /78 HR 123 10:16 120/83  HR 129 11:46 147/94  HR119 1:51 134/88  HR 112 3:48 140/92  HR 115 9:31 112/72  HR 101   Rested for about 30 minutes. Felt normal about 1 hr later.    Has noticed more DOE for the past several weeks.   Jan. 23, 2017: Sheri Montgomery is doing ok.   Had at least 2 episodes of SVT on the  monitor . Was started on metoprolol XL 25 mg a day.   Did not want to increase metoprolol but added Propranolol to her low dose metoprolol .  Stress myoview was normal - no ischemia, normal LV function .   March 20, 2016  Doing ok.   Takes Toprol XL 25 mg after lunch Notices that her HR is faster in the afternoon.  Needs to walk more   Feb. 7, 2019:  Doing well.   Rare occasionaal episodes of SVT. Seems to be when the Toprol is wearing off.   Takes propranolol which takes care of it   October 12, 2018: He is doing very well.  She is seen for follow-up of episodes of supraventricular tachycardia and mild hyperlipidemia.  Also has mild aortic insufficiency. Has rare episodes of SVT  - typically 4-5 each month  typcially occurs after lunch .   These resolve   October 11, 2019: Sheri Montgomery is seen back  today for follow-up of her SVT, hyperlipidemia and mild aortic insufficiency. Rare episodes of tachycardia  [place] in [place].  ( [address] [place],   [place])  [place])   She has moderate AI.  Is difficult to see the valve but it looks like a 3 leaflet valve although the images are not definitive.  We have not done a transesophageal echo. Moderate dilatation of her ascending aorta.   Current Outpatient Medications on File Prior to Visit  Medication Sig Dispense Refill  . aspirin 81 MG tablet Take 81 mg by mouth daily.    Marland Kitchen atorvastatin (LIPITOR) 10 MG tablet Take 1 tablet (10 mg total) by mouth daily. 90 tablet 1  . CALCIUM PO Take 1 tablet by mouth daily.    . Cholecalciferol (VITAMIN D PO) Take 1 tablet by mouth daily.     Marland Kitchen docusate sodium (COLACE) 50 MG capsule Take 50 mg by mouth 2 (two) times daily as needed for mild constipation.    . metoprolol succinate (TOPROL-XL) 25 MG 24 hr tablet Take 1 tablet (25 mg total) by mouth daily. 90 tablet 1  . Naproxen Sodium (ALEVE PO) Take 1 tablet by mouth as needed (AS NEEDED FOR MILD PAIN).      . NON FORMULARY Take 1,000 mg by mouth 2 (two) times daily. Viactive (Calcium)    . propranolol (INDERAL) 10 MG tablet TAKE ONE TABLET BY MOUTH FOUR TIMES DAILY AS NEEDED FOR FAST HEART RATE AND/OR PALPITATIONS. 360 tablet 1   No current facility-administered medications on file prior to visit.    Allergies  Allergen Reactions  . Crestor [Rosuvastatin]     Causes muscle aches     Past Medical History:  Diagnosis Date  . Chest pain   . Hypercholesterolemia   . Hypertension     Past Surgical History:  Procedure Laterality Date  . ANKLE SURGERY    . TONSILLECTOMY      Social History   Tobacco Use  Smoking Status Never Smoker  Smokeless Tobacco Never Used    Social History   Substance and Sexual Activity  Alcohol Use Yes    Family History  Problem Relation Age of Onset  . Heart attack Father   . Hypertension Father   . Heart attack Brother   . Stroke Neg Hx   . Breast cancer Neg Hx     Reviw of Systems:  Reviewed,  All other symptoms are negative   Physical Exam: There were no vitals taken for this visit.  GEN:  Well nourished, well developed in no acute distress HEENT: Normal NECK: No JVD; No carotid bruits LYMPHATICS: No lymphadenopathy CARDIAC: RRR  RESPIRATORY:  Clear to auscultation without rales, wheezing or rhonchi  ABDOMEN: Soft, non-tender, non-distended MUSCULOSKELETAL:  No edema; No deformity  SKIN: Warm and dry NEUROLOGIC:  Alert and oriented x 3   ECG: October 11, 2019: Normal sinus rhythm at 86.  No ST or T wave changes.  Assessment / Plan:   1.   SVT -no recurrent episodes of SVT.  She has propranolol that she takes on an as-needed basis.  2. Aortic insufficiency -    last echocardiogram was in 2016.  Is difficult to see the valve although I suspect it is a 3 leaflet valve.  There is no doming of the valve in the parasternal long axis view.  She does have mild to moderate aortic insufficiency.  She also  has moderate dilatation  of the ascending aorta.  We will schedule her for a CT angiogram of her aorta for further evaluation of that.  We will also schedule her for repeat echocardiogram next year.  [place]  She will likely transition to a cardiologist there.  We will make sure that she is up-to-date on her echocardiogram and other cardiac evaluations.  3.  Mildly dilated ascending aorta: Last echocardiogram suggested that her ascending aorta was mildly dilated.  She is never had a CT angiogram of her aorta.  We will schedule her for a chest CT angiogram for further evaluation of her dilated aorta.  Her aortic valve appears to be 3 leaflet valve but the images are quite difficult and we were not able to see the aortic valve is well as we would want to.  The fact that she has aortic insufficiency and dilated aorta suggest that she may have a bicuspid valve.  Will continue to follow her.  4. Hypertension -     blood pressure is well controlled.   5. Hyperlipidemia -     She has mild hyperlipidemia.  We have had her on low-dose atorvastatin because she has a family history of premature coronary artery disease.  Her brother died of a myocardial infarction at age 58.  Because of this we tended to be a bit more aggressive with her cholesterol.  Her primary medical doctor has suggested that she may not need the atorvastatin.  They will discuss the fact that she has a strong family history of coronary artery disease.  In any case she will need close follow-up. I do think it is okay if she stops her low-dose aspirin.  Mertie Moores, MD  10/11/2019 11:20 AM    [place],  [place], [place]  [address] Pager [phone] Phone: [phone]; Fax: [phone]

## 2019-11-06 DIAGNOSIS — Z23 Encounter for immunization: Secondary | ICD-10-CM | POA: Diagnosis not present

## 2019-11-13 ENCOUNTER — Ambulatory Visit: Payer: Self-pay

## 2019-11-17 ENCOUNTER — Other Ambulatory Visit: Payer: BC Managed Care – PPO | Admitting: *Deleted

## 2019-11-17 ENCOUNTER — Other Ambulatory Visit: Payer: Self-pay

## 2019-11-17 DIAGNOSIS — I10 Essential (primary) hypertension: Secondary | ICD-10-CM

## 2019-11-17 DIAGNOSIS — I471 Supraventricular tachycardia: Secondary | ICD-10-CM | POA: Diagnosis not present

## 2019-11-17 DIAGNOSIS — I351 Nonrheumatic aortic (valve) insufficiency: Secondary | ICD-10-CM

## 2019-11-17 LAB — BASIC METABOLIC PANEL
BUN/Creatinine Ratio: 16 (ref 12–28)
BUN: 13 mg/dL (ref 8–27)
CO2: 21 mmol/L (ref 20–29)
Calcium: 9.9 mg/dL (ref 8.7–10.3)
Chloride: 105 mmol/L (ref 96–106)
Creatinine, Ser: 0.8 mg/dL (ref 0.57–1.00)
GFR calc Af Amer: 90 mL/min/{1.73_m2} (ref >59)
GFR calc non Af Amer: 78 mL/min/{1.73_m2} (ref >59)
Glucose: 91 mg/dL (ref 65–99)
Potassium: 4.2 mmol/L (ref 3.5–5.2)
Sodium: 142 mmol/L (ref 134–144)

## 2019-11-20 ENCOUNTER — Other Ambulatory Visit: Payer: Self-pay

## 2019-11-20 ENCOUNTER — Ambulatory Visit (INDEPENDENT_AMBULATORY_CARE_PROVIDER_SITE_OTHER)
Admission: RE | Admit: 2019-11-20 | Discharge: 2019-11-20 | Disposition: A | Discharge status: Home / Self Care | Payer: BC Managed Care – PPO | Source: Ambulatory Visit | Attending: Cardiovascular Disease | Admitting: Cardiovascular Disease

## 2019-11-20 DIAGNOSIS — I77819 Aortic ectasia, unspecified site: Secondary | ICD-10-CM

## 2019-11-20 MED ORDER — IOHEXOL 350 MG/ML SOLN
80.0000 mL | Freq: Once | INTRAVENOUS | Status: AC | PRN
  Administered 2019-11-20: 10:00:00 80 mL via INTRAVENOUS

## 2019-11-24 DIAGNOSIS — Z23 Encounter for immunization: Secondary | ICD-10-CM | POA: Diagnosis not present

## 2020-04-01 DIAGNOSIS — I471 Supraventricular tachycardia: Secondary | ICD-10-CM | POA: Diagnosis not present

## 2020-04-08 DIAGNOSIS — I712 Thoracic aortic aneurysm, without rupture: Secondary | ICD-10-CM | POA: Diagnosis not present

## 2020-04-08 DIAGNOSIS — E782 Mixed hyperlipidemia: Secondary | ICD-10-CM | POA: Diagnosis not present

## 2020-04-08 DIAGNOSIS — I471 Supraventricular tachycardia: Secondary | ICD-10-CM | POA: Diagnosis not present

## 2020-04-22 ENCOUNTER — Other Ambulatory Visit: Payer: Self-pay

## 2020-04-22 MED ORDER — ATORVASTATIN CALCIUM 10 MG PO TABS: 10.0000 mg | ORAL_TABLET | Freq: Every day | ORAL | 1 refills | Status: DC

## 2020-04-22 MED ORDER — METOPROLOL SUCCINATE ER 25 MG PO TB24: 25.0000 mg | ORAL_TABLET | Freq: Every day | ORAL | 1 refills | Status: DC

## 2020-06-17 IMAGING — MG DIGITAL SCREENING BILATERAL MAMMOGRAM WITH CAD
4 series · 4 of 4 positions shown · non-contrast
Comparison: Previous exam(s).

CLINICAL DATA: Screening.

EXAM:
DIGITAL SCREENING BILATERAL MAMMOGRAM WITH CAD

[R MLO]
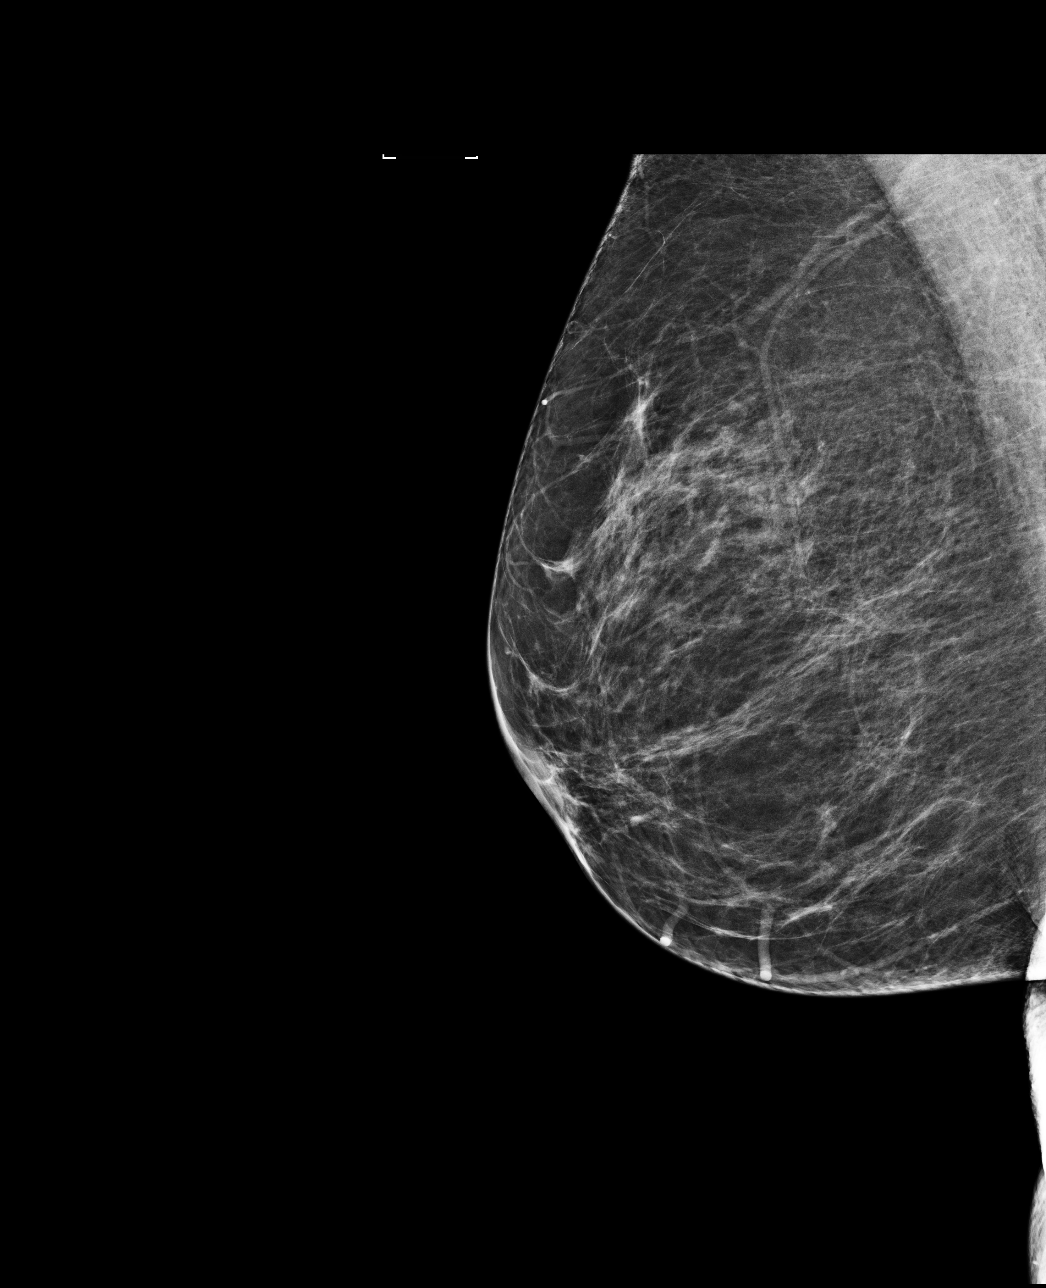

[L MLO]
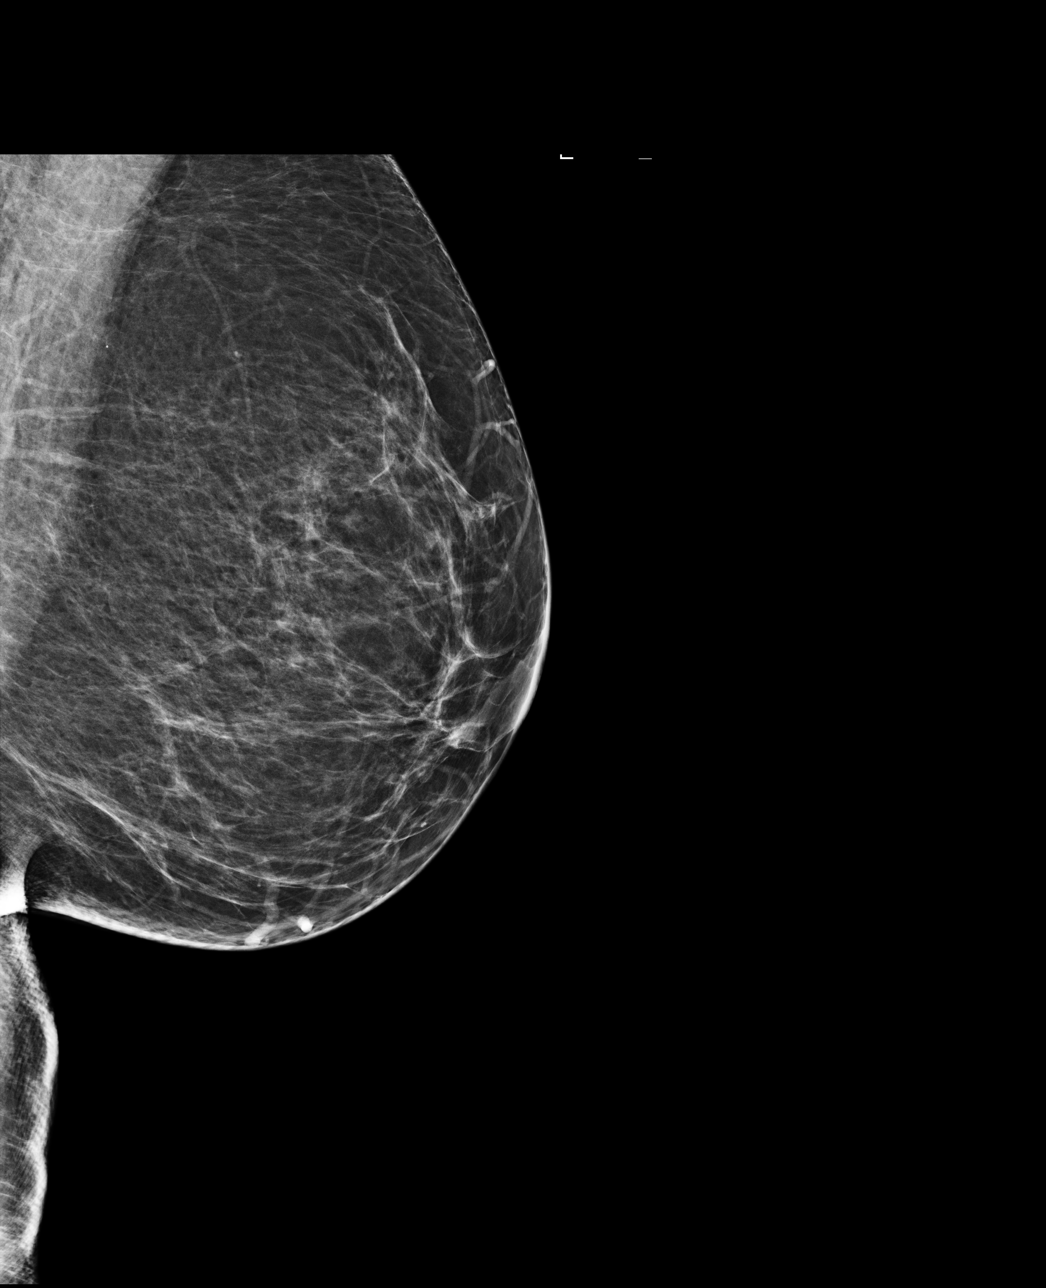

[L CC]
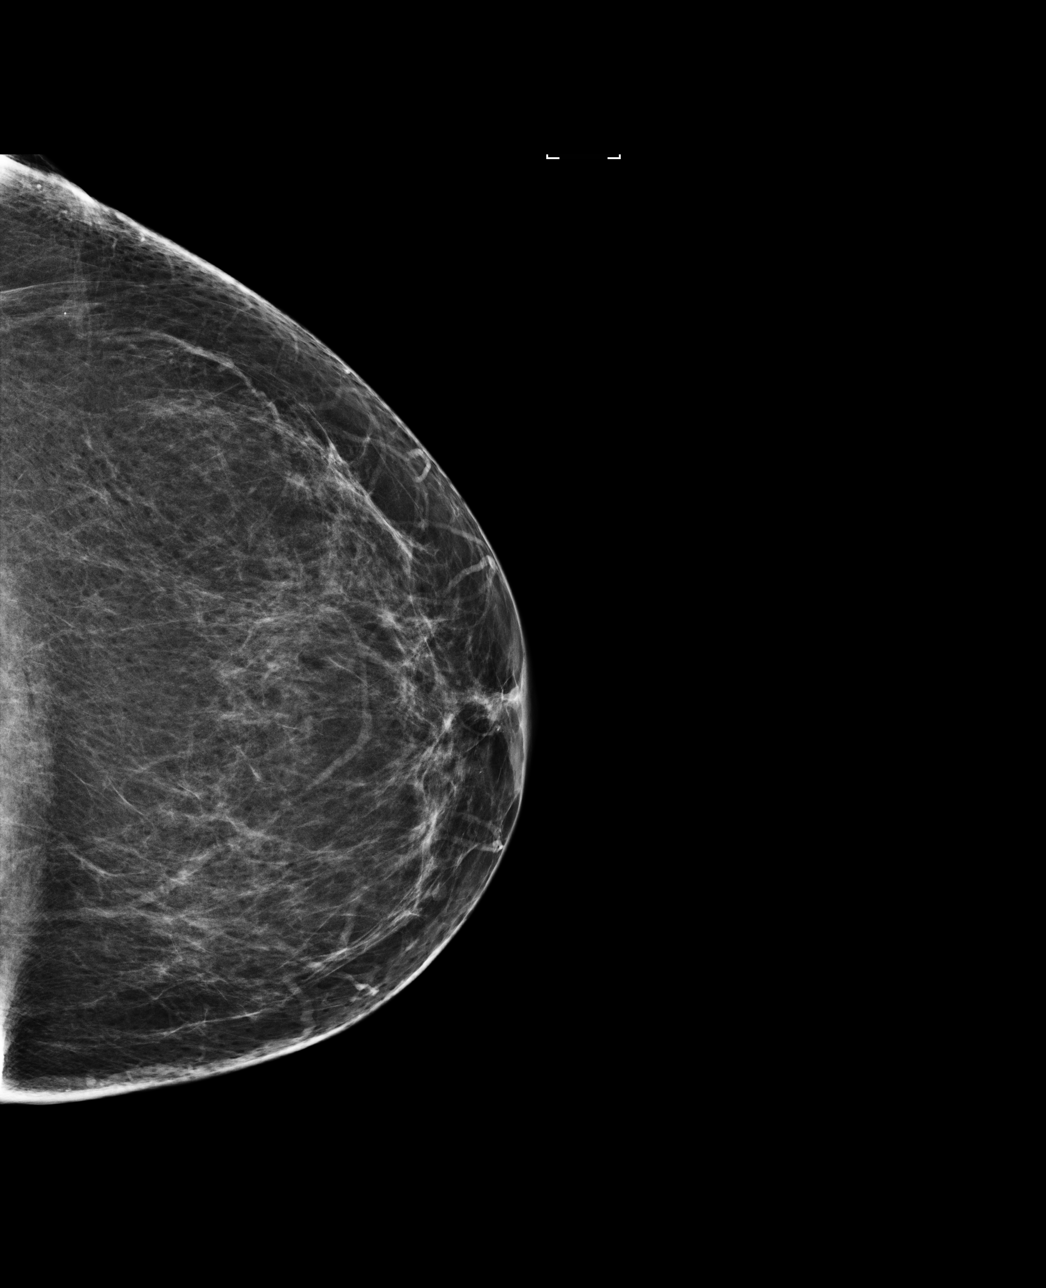

[R CC]
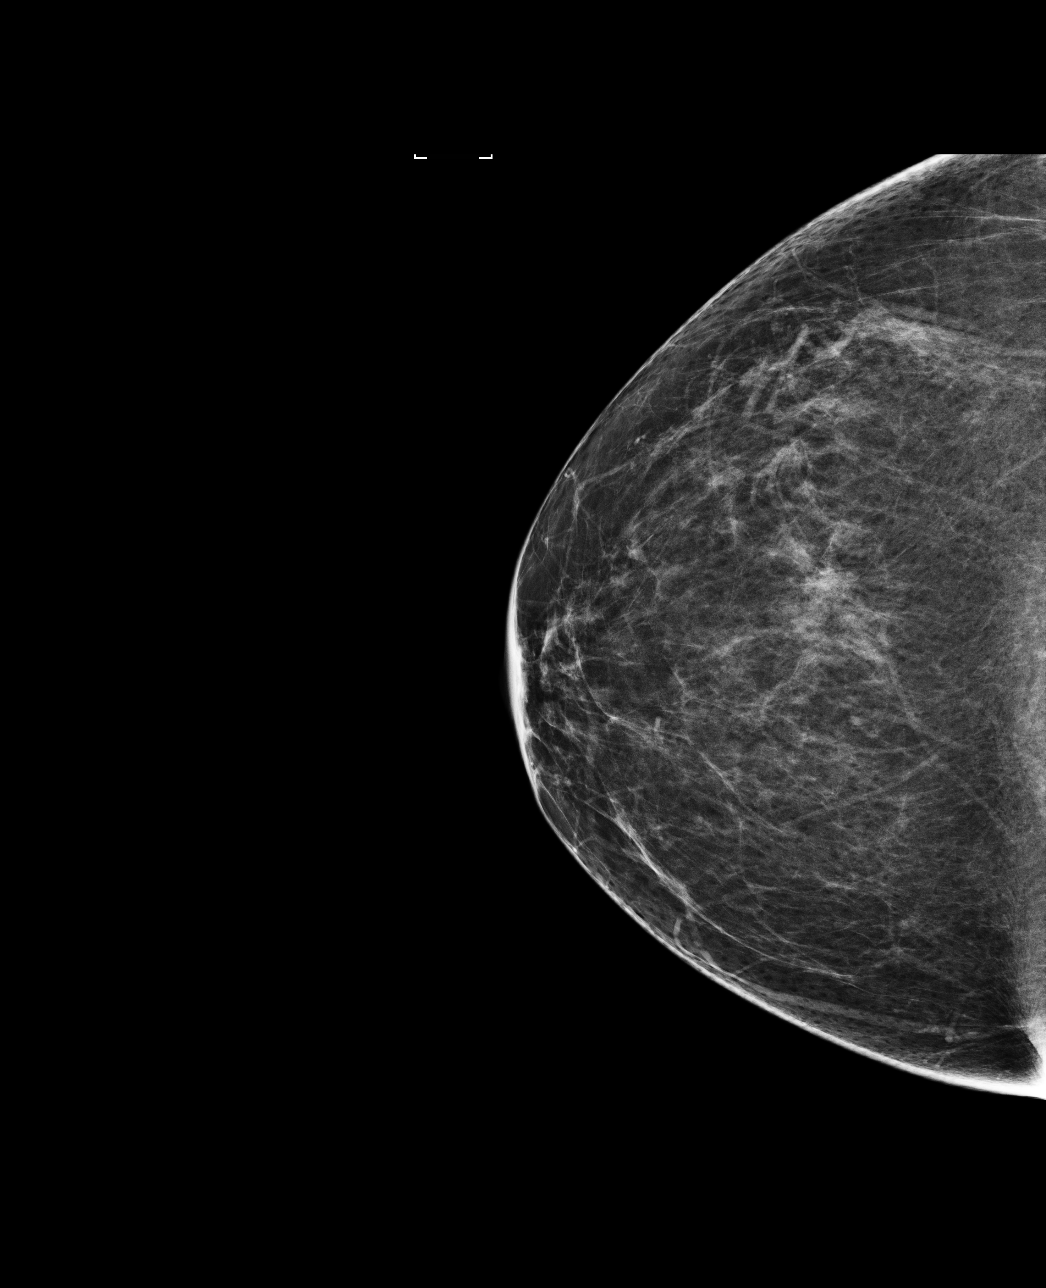

[4 of 4 positions shown; findings below may reference images not displayed]

ACR Breast Density Category b: There are scattered areas of
fibroglandular density.
FINDINGS: In the right breast, possible distortion warrants further
evaluation. In the left breast, no findings suspicious for
malignancy. Images were processed with CAD.
IMPRESSION: Further evaluation is suggested for possible distortion in the right
breast.

RECOMMENDATION:
Diagnostic mammogram and possibly ultrasound of the right breast.
(Code:JI-[id number])

The patient will be contacted regarding the findings, and additional
imaging will be scheduled.

BI-RADS CATEGORY  0: Incomplete. Need additional imaging evaluation
and/or prior mammograms for comparison.

## 2020-06-19 ENCOUNTER — Other Ambulatory Visit: Payer: Self-pay

## 2020-06-19 MED ORDER — ATORVASTATIN CALCIUM 10 MG PO TABS: 10.0000 mg | ORAL_TABLET | Freq: Every day | ORAL | 1 refills | Status: DC

## 2020-06-19 MED ORDER — PROPRANOLOL HCL 10 MG PO TABS: ORAL_TABLET | ORAL | 1 refills | Status: AC

## 2020-06-19 MED ORDER — METOPROLOL SUCCINATE ER 25 MG PO TB24: 25.0000 mg | ORAL_TABLET | Freq: Every day | ORAL | 1 refills | Status: DC

## 2020-06-23 IMAGING — MG DIGITAL DIAGNOSTIC UNILATERAL RIGHT MAMMOGRAM WITH TOMO AND CAD
4 series · 4 of 12 positions shown · non-contrast
Comparison: Previous exam(s).

CLINICAL DATA: 61-year-old female for further evaluation of
possible RIGHT breast distortion on screening mammogram

EXAM:
DIGITAL DIAGNOSTIC UNILATERAL RIGHT MAMMOGRAM WITH CAD AND TOMO

[R CC synth-2D]
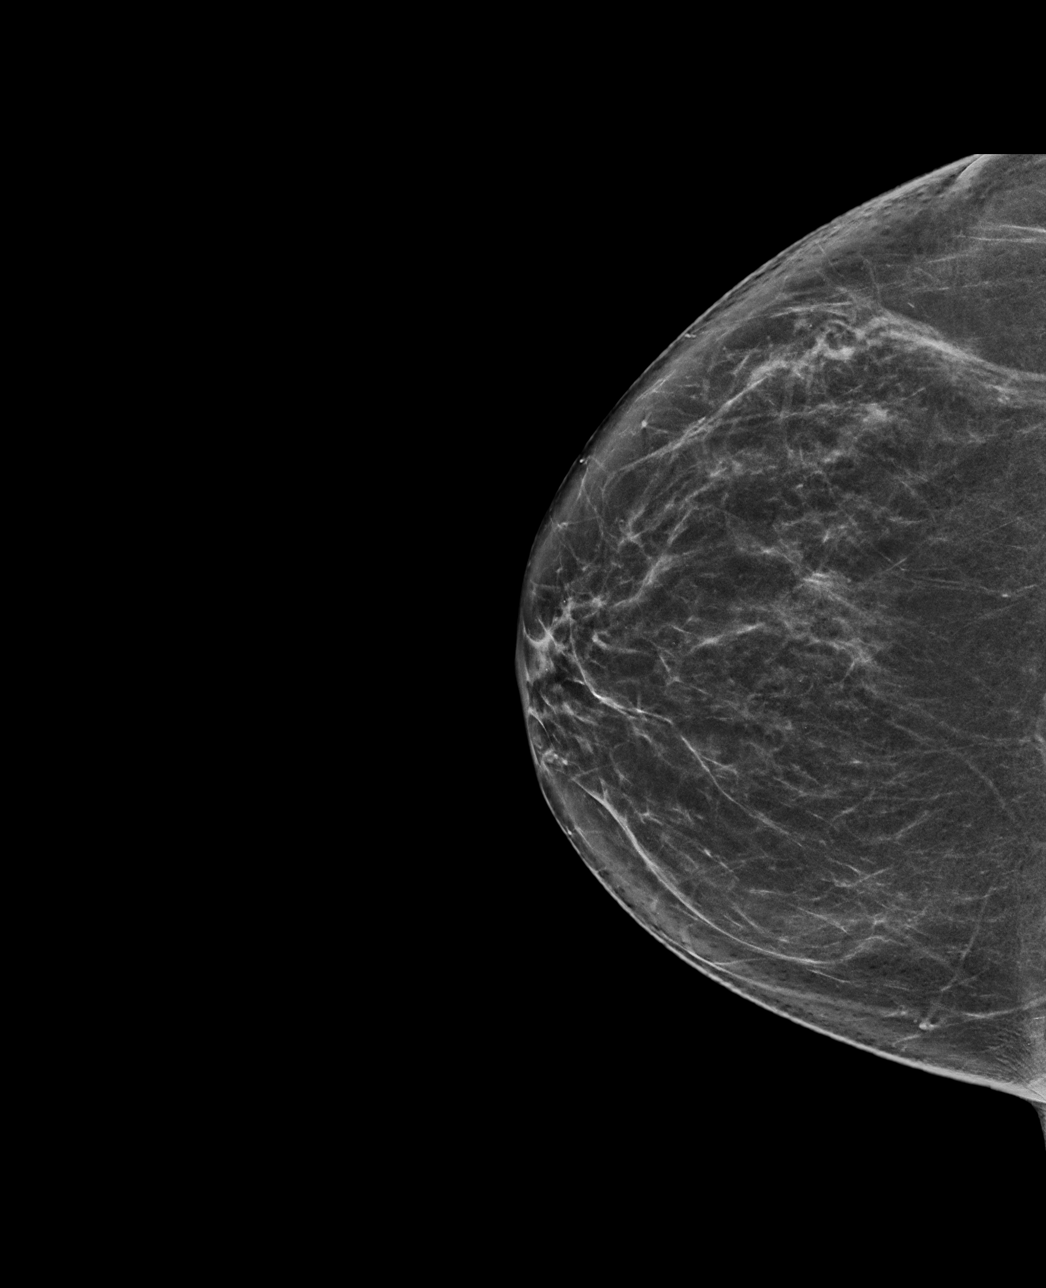

[R MLO synth-2D]
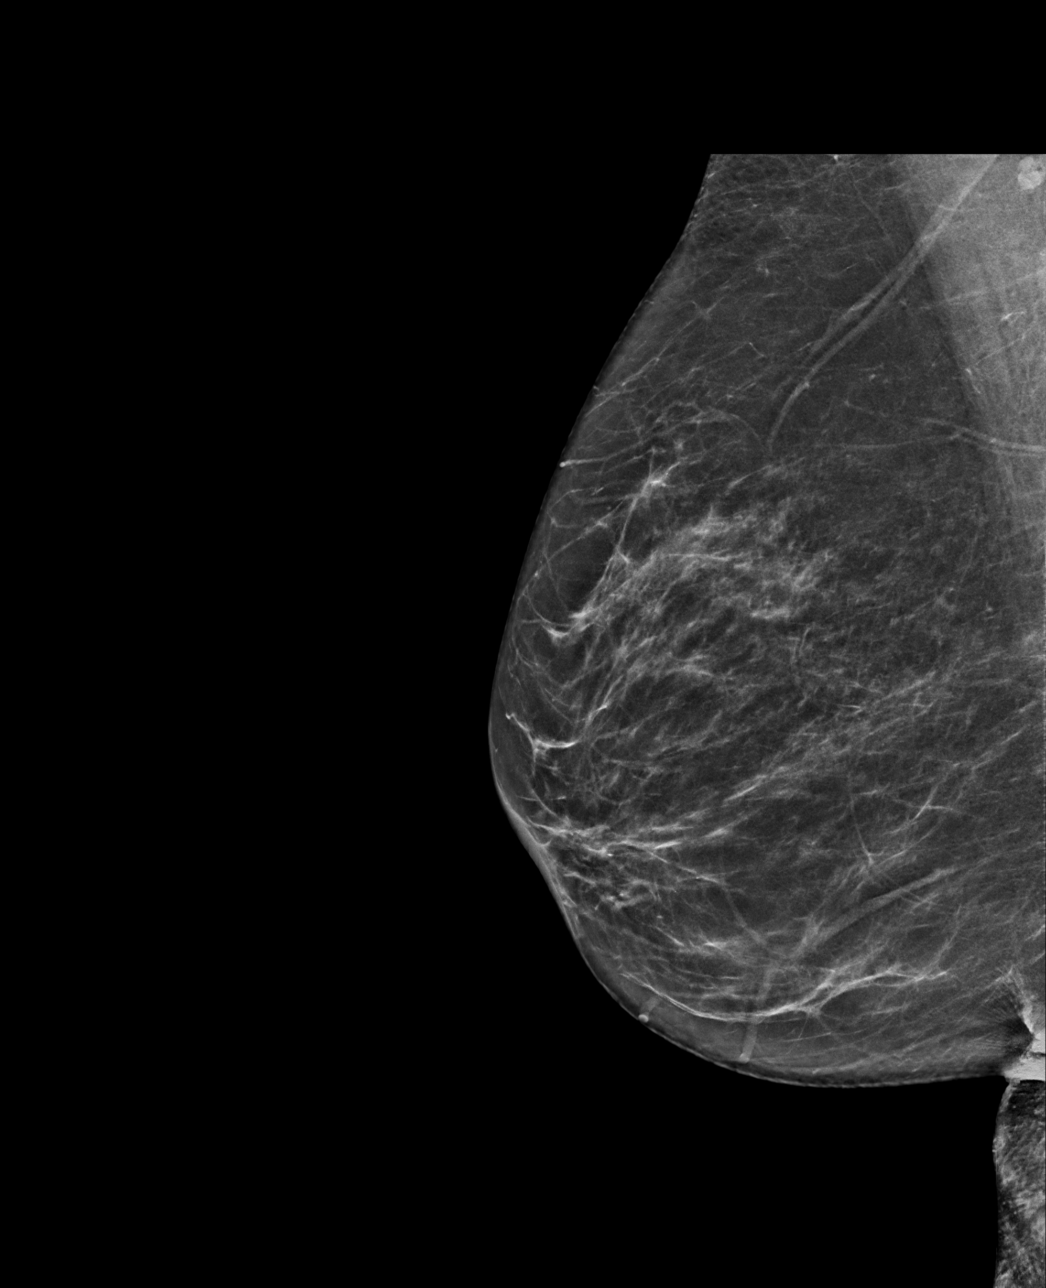

[R MLO tomo · tomo slice 36/71.0]
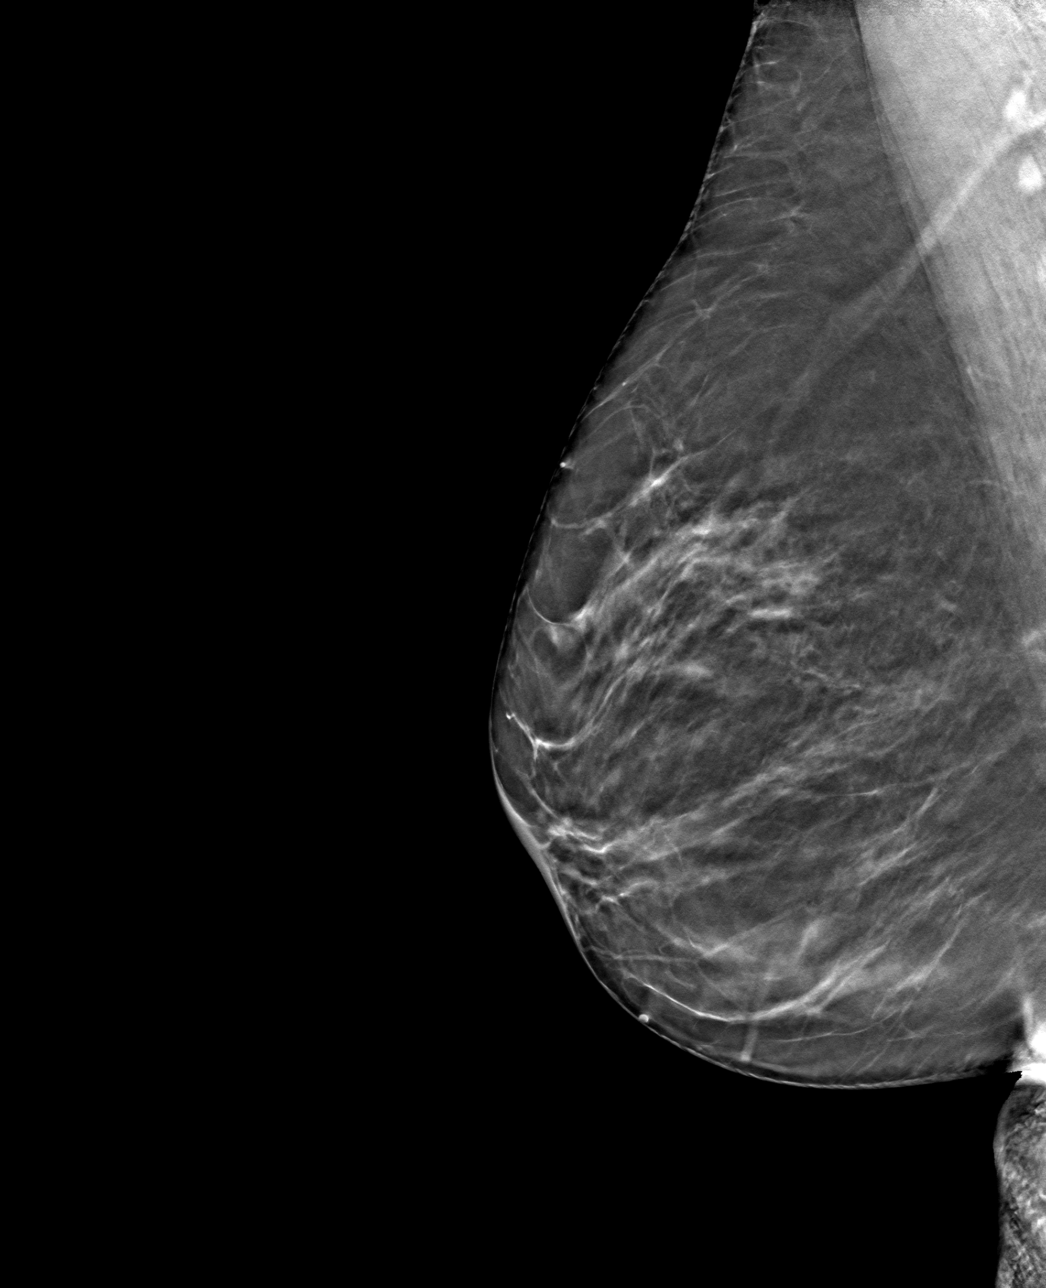

[R CC tomo · tomo slice 37/73.0]
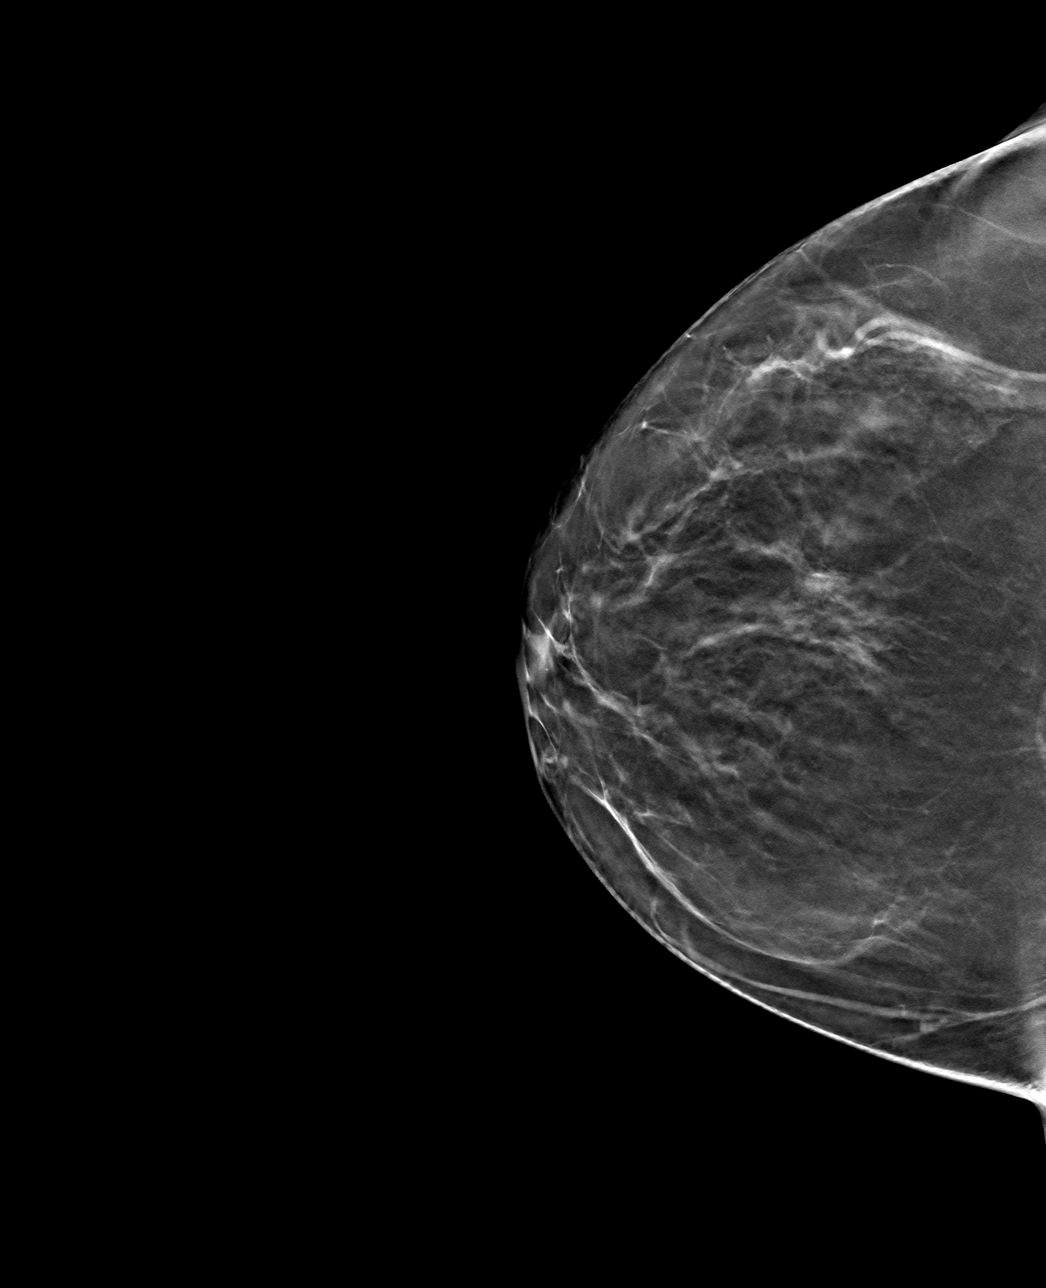

[4 of 12 positions shown; findings below may reference images not displayed]

ACR Breast Density Category b: There are scattered areas of
fibroglandular density.
FINDINGS: 2D/3D full field views of the RIGHT breast demonstrate no persistent
distortion or abnormality in the area of the screening study
finding. No suspicious RIGHT breast abnormalities are noted.

Mammographic images were processed with CAD.
IMPRESSION: No persistent abnormality/distortion in the area of the screening
study finding.

RECOMMENDATION:
Bilateral screening mammogram in 1 year

I have discussed the findings and recommendations with the patient.
Results were also provided in writing at the conclusion of the
visit. If applicable, a reminder letter will be sent to the patient
regarding the next appointment.

BI-RADS CATEGORY  1: Negative.

## 2020-09-05 ENCOUNTER — Other Ambulatory Visit: Payer: Self-pay | Admitting: Obstetrics and Gynecology

## 2020-09-05 ENCOUNTER — Telehealth: Payer: Self-pay | Admitting: Cardiovascular Disease

## 2020-09-05 DIAGNOSIS — Z1231 Encounter for screening mammogram for malignant neoplasm of breast: Secondary | ICD-10-CM

## 2020-09-05 NOTE — Telephone Encounter (Signed)
Sheri Montgomery is calling wanting to know if Dr. Elease Hashimoto is wanting labs prior to her scheduled 1 year f/u. Please advise.

## 2020-10-09 ENCOUNTER — Other Ambulatory Visit (HOSPITAL_COMMUNITY): Payer: BC Managed Care – PPO

## 2020-10-14 ENCOUNTER — Ambulatory Visit: Payer: BC Managed Care – PPO | Admitting: Cardiovascular Disease

## 2020-10-29 ENCOUNTER — Other Ambulatory Visit: Payer: Self-pay | Admitting: Obstetrics and Gynecology

## 2020-10-29 ENCOUNTER — Other Ambulatory Visit: Payer: Self-pay

## 2020-10-29 ENCOUNTER — Ambulatory Visit
Admission: RE | Admit: 2020-10-29 | Discharge: 2020-10-29 | Disposition: A | Discharge status: Home / Self Care | Payer: Medicare Other | Source: Ambulatory Visit | Attending: Obstetrics and Gynecology | Admitting: Obstetrics and Gynecology

## 2020-10-29 DIAGNOSIS — Z1231 Encounter for screening mammogram for malignant neoplasm of breast: Secondary | ICD-10-CM

## 2020-11-13 ENCOUNTER — Encounter: Payer: Self-pay | Admitting: Cardiovascular Disease

## 2020-11-13 NOTE — Progress Notes (Signed)
Sheri Montgomery Date of Birth  1955-10-22       [facility] Office [address]  [address], [address] [place], [place]  [address]   [place], [place]  [address] [phone]     [phone]   Fax  [phone]    Fax [phone]  Problem List: 1. hyperlipidemia 2. Chest pain 3. Hypertension - now controlled with diet.     Sheri Montgomery is doing well.  She has been busy taking care of her elderly father.  She has not had any cardiac problems.  She is walking several days a week.   March 29, 2013:  Sheri Montgomery is doing well.  Exercising on occasion.  She is not tolerating the niacin.    Trying to stick to a good diet.   05/10/2014:    Sheri Montgomery is doing ok.  Still busy taking care of her father (lives in [place])  She was walking regularly until 2 months ago when she injurred her foot.    Sept. 13, 2016:  Sheri Montgomery is doing well.  Is now on Phentermine - HR is faster .   She feels like her system is "Revved up "   No CP , doing well.   September 15, 2015 Sheri Montgomery has passed out twice  Has been on Phentermine.   Cut the dose in 1/2 and then to 1/4 tablet.  On Nov. 16, was standing in the kitchen,  Had a split second warning and then passed out. Has been dieting ,  Had not been eating much ( but no less that usual )  Quit Phenermine on that day  Several weeks later,  ( Dec. 10)  She was again standin in the kitchen, felt her hear racing. Could not tell if her HR was regular or irregular . Took her HR and BP  Multiple times for the next hour   Dec. 10, 2016  9:29 203/183 -   HR = 178 9:40           HR  Of 184 9:52 120 /78 HR 123 10:16 120/83  HR 129 11:46 147/94  HR119 1:51 134/88  HR 112 3:48 140/92  HR 115 9:31 112/72  HR 101   Rested for about 30 minutes. Felt normal about 1 hr later.    Has noticed more DOE for the past several weeks.   Jan. 23, 2017: Sheri Montgomery is doing ok.   Had at least 2 episodes of SVT on the monitor . Was started on  metoprolol XL 25 mg a day.   Did not want to increase metoprolol but added Propranolol to her low dose metoprolol .  Stress myoview was normal - no ischemia, normal LV function .   March 20, 2016  Doing ok.   Takes Toprol XL 25 mg after lunch Notices that her HR is faster in the afternoon.  Needs to walk more   Feb. 7, 2019:  Doing well.   Rare occasionaal episodes of SVT. Seems to be when the Toprol is wearing off.   Takes propranolol which takes care of it   October 12, 2018: He is doing very well.  She is seen for follow-up of episodes of supraventricular tachycardia and mild hyperlipidemia.  Also has mild aortic insufficiency. Has rare episodes of SVT  - typically 4-5 each month  typcially occurs after lunch .   These resolve   October 11, 2019: Sheri Montgomery is seen back today for follow-up  of her SVT, hyperlipidemia and mild aortic insufficiency. Rare episodes of tachycardia  [place] now live [place] in [facility].  ( [address] [place],   [place] is [place])  [address] an [place] below [place])   She has moderate AI.  Is difficult to see the valve but it looks like a 3 leaflet valve although the images are not definitive.  We have not done a transesophageal echo. Moderate dilatation of her ascending aorta.   November 15, 2020:  Sheri Montgomery is seen today for follow up visit. Hx of SVT, Moderate AI with mod  aortic dilatation  Hx of hyperlipidemia  CT angio of the chest in March , 2021 shows an ascending aorta of 4.1 cm. Labs from primary md show a Trig of 200 ( up from 91 in 2020)   Has occasional episodes of tachycardia.  She takes propranlolol 2-3 times a month   Current Outpatient Medications on File Prior to Visit  Medication Sig Dispense Refill  . CALCIUM PO Take 1 tablet by mouth daily.    . Cholecalciferol 50 MCG (2000 UT) CAPS Take 1 capsule by mouth daily.    Marland Kitchen docusate sodium (COLACE) 50 MG capsule Take 50 mg by mouth 2 (two) times daily as needed for mild constipation.    .  famotidine (PEPCID) 10 MG tablet Take 1 tablet by mouth daily.    . Naproxen Sodium (ALEVE PO) Take 1 tablet by mouth as needed (AS NEEDED FOR MILD PAIN).     . NON FORMULARY Take 1,000 mg by mouth 2 (two) times daily. Viactive (Calcium)    . propranolol (INDERAL) 10 MG tablet TAKE ONE TABLET BY MOUTH FOUR TIMES DAILY AS NEEDED FOR FAST HEART RATE AND/OR PALPITATIONS. 360 tablet 1   No current facility-administered medications on file prior to visit.    Allergies  Allergen Reactions  . Crestor [Rosuvastatin]     Causes muscle aches     Past Medical History:  Diagnosis Date  . Chest pain   . Hypercholesterolemia   . Hypertension     Past Surgical History:  Procedure Laterality Date  . ANKLE SURGERY    . TONSILLECTOMY      Social History   Tobacco Use  Smoking Status Never Smoker  Smokeless Tobacco Never Used    Social History   Substance and Sexual Activity  Alcohol Use Yes    Family History  Problem Relation Age of Onset  . Heart attack Father   . Hypertension Father   . Heart attack Brother   . Stroke Neg Hx   . Breast cancer Neg Hx     Reviw of Systems:  Reviewed,  All other symptoms are negative    Physical Exam: Blood pressure 126/78, pulse 73, height 5\' 6"  (1.676 m), weight 156 lb (70.8 kg), SpO2 98 %.  GEN:  Well nourished, well developed in no acute distress HEENT: Normal NECK: No JVD; No carotid bruits LYMPHATICS: No lymphadenopathy CARDIAC: RRR, 2/6 diastolic murmur , brisk peripheral pulses.  RESPIRATORY:  Clear to auscultation without rales, wheezing or rhonchi  ABDOMEN: Soft, non-tender, non-distended MUSCULOSKELETAL:  No edema; No deformity  SKIN: Warm and dry NEUROLOGIC:  Alert and oriented x 3    ECG: November 15, 2020: Normal sinus rhythm at 73.  No ST or T wave changes.    Assessment / Plan:   1.   SVT -she still has occasional episodes of SVT.  She takes propranolol on an as-needed basis.  This  seems to work well.   2.  Aortic insufficiency -    she had an echocardiogram today.  Her aortic insufficiency appears to be moderate.  She will be transferring to a new cardiologist down in [place] where they now live.   3.  Mildly dilated ascending aorta: She had an echocardiogram today.  The ascending aorta remains mildly to moderately dilated.  It does not appear to be so different from her previous echo last year  She had an CT angiogram of her aorta last year which revealed of a sending aorta measurement of 4.1 cm.  I would recommend that she have a repeat CT angiogram within a year for follow-up. .  4. Hypertension -    blood pressure remains fairly well controlled.   5. Hyperlipidemia -    cholesterol levels were reviewed.  Her cholesterol levels look good.  Her LDL is 84.  HDL is 63.  Total cholesterol is 187.  Her triglyceride level has increased from 91-200.  She admits to eating more carbohydrates and she is also not able to exercise because of some knee injuries.  I encouraged her to try to exercise as much as possible and to consider getting a stationary bike.   Kristeen Miss, MD  11/15/2020 4:19 PM    [facility] [address] [place],  [address] [place], [place]  [address] Pager [phone] Phone: [phone]; Fax: [phone]

## 2020-11-15 ENCOUNTER — Ambulatory Visit (HOSPITAL_COMMUNITY): Payer: Medicare Other | Attending: Internal Medicine

## 2020-11-15 ENCOUNTER — Ambulatory Visit (INDEPENDENT_AMBULATORY_CARE_PROVIDER_SITE_OTHER): Payer: Medicare Other | Admitting: Cardiovascular Disease

## 2020-11-15 ENCOUNTER — Encounter: Payer: Self-pay | Admitting: Cardiovascular Disease

## 2020-11-15 ENCOUNTER — Other Ambulatory Visit: Payer: Self-pay

## 2020-11-15 VITALS — BP 126/78 | HR 73 | Ht 66.0 in | Wt 156.0 lb

## 2020-11-15 DIAGNOSIS — I10 Essential (primary) hypertension: Secondary | ICD-10-CM

## 2020-11-15 DIAGNOSIS — I77819 Aortic ectasia, unspecified site: Secondary | ICD-10-CM

## 2020-11-15 DIAGNOSIS — I471 Supraventricular tachycardia: Secondary | ICD-10-CM

## 2020-11-15 DIAGNOSIS — I351 Nonrheumatic aortic (valve) insufficiency: Secondary | ICD-10-CM | POA: Diagnosis not present

## 2020-11-15 LAB — ECHOCARDIOGRAM COMPLETE
AR max vel: 2.88 cm2
AV Area VTI: 3.47 cm2
AV Area mean vel: 2.83 cm2
AV Mean grad: 9 mmHg
AV Peak grad: 16.7 mmHg
Ao pk vel: 2.05 m/s
Area-P 1/2: 2.78 cm2
MV VTI: 5.06 cm2
P 1/2 time: 456 msec
S' Lateral: 3.15 cm

## 2020-11-15 MED ORDER — ATORVASTATIN CALCIUM 10 MG PO TABS: 10.0000 mg | ORAL_TABLET | Freq: Every day | ORAL | 3 refills | Status: AC

## 2020-11-15 MED ORDER — METOPROLOL SUCCINATE ER 25 MG PO TB24: 25.0000 mg | ORAL_TABLET | Freq: Every day | ORAL | 3 refills | Status: AC

## 2020-11-15 NOTE — Patient Instructions (Signed)
Medication Instructions:  Your physician recommends that you continue on your current medications as directed. Please refer to the Current Medication list given to you today.  *If you need a refill on your cardiac medications before your next appointment, please call your pharmacy*   Lab Work: none If you have labs (blood work) drawn today and your tests are completely normal, you will receive your results only by: Marland Kitchen MyChart Message (if you have MyChart) OR . A paper copy in the mail If you have any lab test that is abnormal or we need to change your treatment, we will call you to review the results.   Testing/Procedures: none   Follow-Up: At [facility], you and your health needs are our priority.  As part of our continuing mission to provide you with exceptional heart care, we have created designated Provider Care Teams.  These Care Teams include your primary Cardiologist (physician) and Advanced Practice Providers (APPs -  Physician Assistants and Nurse Practitioners) who all work together to provide you with the care you need, when you need it.   Your next appointment:    As Needed  The format for your next appointment:   In Person  Provider:   You may see Kristeen Miss, MD or one of the following Advanced Practice Providers on your designated Care Team:    Tereso Newcomer, PA-C  [place], [place]
